# Patient Record
Sex: Male | Born: 1950 | Race: White | Hispanic: No | Marital: Married | Smoking: Never smoker
Health system: Southern US, Community
[De-identification: ages and names within clinical notes are randomized; demographics above are authoritative.]

## PROBLEM LIST (undated history)

## (undated) DIAGNOSIS — I493 Ventricular premature depolarization: Secondary | ICD-10-CM

## (undated) DIAGNOSIS — E785 Hyperlipidemia, unspecified: Secondary | ICD-10-CM

## (undated) DIAGNOSIS — D696 Thrombocytopenia, unspecified: Secondary | ICD-10-CM

## (undated) DIAGNOSIS — K5792 Diverticulitis of intestine, part unspecified, without perforation or abscess without bleeding: Secondary | ICD-10-CM

## (undated) DIAGNOSIS — I10 Essential (primary) hypertension: Secondary | ICD-10-CM

## (undated) DIAGNOSIS — R202 Paresthesia of skin: Secondary | ICD-10-CM

## (undated) DIAGNOSIS — R7303 Prediabetes: Secondary | ICD-10-CM

## (undated) HISTORY — DX: Prediabetes: R73.03

## (undated) HISTORY — DX: Paresthesia of skin: R20.2

## (undated) HISTORY — DX: Ventricular premature depolarization: I49.3

## (undated) HISTORY — PX: CYST EXCISION: SHX5701

## (undated) HISTORY — DX: Hyperlipidemia, unspecified: E78.5

## (undated) HISTORY — DX: Thrombocytopenia, unspecified: D69.6

## (undated) HISTORY — PX: TONSILLECTOMY: SUR1361

---

## 1998-11-26 ENCOUNTER — Encounter: Payer: Self-pay | Admitting: Family Medicine

## 1998-11-26 ENCOUNTER — Ambulatory Visit (HOSPITAL_COMMUNITY): Admission: RE | Admit: 1998-11-26 | Discharge: 1998-11-26 | Payer: Self-pay | Admitting: Family Medicine

## 1998-12-04 ENCOUNTER — Encounter: Admission: RE | Admit: 1998-12-04 | Discharge: 1998-12-18 | Payer: Self-pay | Admitting: Family Medicine

## 2007-03-31 ENCOUNTER — Ambulatory Visit (HOSPITAL_BASED_OUTPATIENT_CLINIC_OR_DEPARTMENT_OTHER): Admission: RE | Admit: 2007-03-31 | Discharge: 2007-03-31 | Payer: Self-pay | Admitting: Surgery

## 2007-03-31 ENCOUNTER — Encounter (INDEPENDENT_AMBULATORY_CARE_PROVIDER_SITE_OTHER): Payer: Self-pay | Admitting: Surgery

## 2009-04-30 ENCOUNTER — Ambulatory Visit (HOSPITAL_BASED_OUTPATIENT_CLINIC_OR_DEPARTMENT_OTHER): Admission: RE | Admit: 2009-04-30 | Discharge: 2009-04-30 | Payer: Self-pay | Admitting: Surgery

## 2009-04-30 ENCOUNTER — Encounter (INDEPENDENT_AMBULATORY_CARE_PROVIDER_SITE_OTHER): Payer: Self-pay | Admitting: Surgery

## 2010-10-15 LAB — BASIC METABOLIC PANEL
BUN: 22 mg/dL (ref 6–23)
CO2: 30 mEq/L (ref 19–32)
Calcium: 9.4 mg/dL (ref 8.4–10.5)
Chloride: 103 mEq/L (ref 96–112)
Creatinine, Ser: 0.92 mg/dL (ref 0.4–1.5)
GFR calc Af Amer: >60 mL/min (ref >60)
GFR calc non Af Amer: >60 mL/min (ref >60)
Glucose, Bld: 146 mg/dL — ABNORMAL HIGH (ref 70–99)
Potassium: 3.9 mEq/L (ref 3.5–5.1)
Sodium: 140 mEq/L (ref 135–145)

## 2010-10-15 LAB — POCT HEMOGLOBIN-HEMACUE: Hemoglobin: 17.4 g/dL — ABNORMAL HIGH (ref 13.0–17.0)

## 2012-08-23 ENCOUNTER — Ambulatory Visit (INDEPENDENT_AMBULATORY_CARE_PROVIDER_SITE_OTHER): Payer: 59 | Admitting: Surgery

## 2012-08-23 ENCOUNTER — Ambulatory Visit (INDEPENDENT_AMBULATORY_CARE_PROVIDER_SITE_OTHER): Payer: Self-pay | Admitting: Surgery

## 2012-08-23 ENCOUNTER — Encounter (INDEPENDENT_AMBULATORY_CARE_PROVIDER_SITE_OTHER): Payer: Self-pay | Admitting: Surgery

## 2012-08-23 VITALS — BP 124/76 | HR 88 | Temp 98.0°F | Resp 16 | Ht 69.0 in | Wt 218.8 lb

## 2012-08-23 DIAGNOSIS — D1779 Benign lipomatous neoplasm of other sites: Secondary | ICD-10-CM

## 2012-08-23 DIAGNOSIS — D171 Benign lipomatous neoplasm of skin and subcutaneous tissue of trunk: Secondary | ICD-10-CM

## 2012-08-23 NOTE — Progress Notes (Signed)
Chief Complaint:  Recurrent left pectoral lipoma  History of Present Illness:  Matthew Compton is an 62 y.o. male had lipoma about the size of a marble this past summer.  It has ballooned since then and now is the size of a racket ball.  Needs to be reexcised.    History reviewed. No pertinent past medical history.  Past Surgical History  Procedure Laterality Date  . Tonsillectomy    . Cyst excision      x2    Current Outpatient Prescriptions  Medication Sig Dispense Refill  . aspirin 81 MG tablet Take 81 mg by mouth daily.      Marland Kitchen atorvastatin (LIPITOR) 20 MG tablet       . fish oil-omega-3 fatty acids 1000 MG capsule Take 2 g by mouth daily.      . niacin (NIASPAN) 500 MG CR tablet       . MICARDIS HCT 80-12.5 MG per tablet        No current facility-administered medications for this visit.   Review of patient's allergies indicates no known allergies. Family History  Problem Relation Age of Onset  . Cancer Mother     lung  . Heart disease Mother   . Heart disease Father    Social History:   reports that he has never smoked. He has never used smokeless tobacco. He reports that  drinks alcohol. He reports that he does not use illicit drugs.   REVIEW OF SYSTEMS - PERTINENT POSITIVES ONLY: noncontributory  Physical Exam:   Blood pressure 124/76, pulse 88, temperature 98 F (36.7 C), temperature source Temporal, resp. rate 16, height 5\' 9"  (1.753 m), weight 218 lb 12.8 oz (99.247 kg). Body mass index is 32.3 kg/(m^2).  Gen:  WDWN WM NAD  Neurological: Alert and oriented to person, place, and time. Motor and sensory function is grossly intact  Head: Normocephalic and atraumatic.  Eyes: Conjunctivae are normal. Pupils are equal, round, and reactive to light. No scleral icterus.  Neck: Normal range of motion. Neck supple. No tracheal deviation or thyromegaly present.  Cardiovascular:  SR without murmurs or gallops.  No carotid bruits Chest:  Racket ball sized mass on left  anterior pectoral region Respiratory: Effort normal.  No respiratory distress. No chest wall tenderness. Breath sounds normal.  No wheezes, rales or rhonchi.  Abdomen:  unremarkable GU: Musculoskeletal: Normal range of motion. Extremities are nontender. No cyanosis, edema or clubbing noted Lymphadenopathy: No cervical, preauricular, postauricular or axillary adenopathy is present Skin: Skin is warm and dry. No rash noted. No diaphoresis. No erythema. No pallor. Pscyh: Normal mood and affect. Behavior is normal. Judgment and thought content normal.   LABORATORY RESULTS: No results found for this or any previous visit (from the past 48 hour(s)).  RADIOLOGY RESULTS: No results found.  Problem List: There is no problem list on file for this patient.   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  Excision under general    [name], MD, [facility] [phone] beeper [phone]  08/23/2012 2:18 PM

## 2012-08-23 NOTE — Patient Instructions (Signed)
Thanks for your patience.  If you need further assistance after leaving the office, please call our office and speak with a [facility] nurse.  [phone].  If you want to leave a message for Dr. Shamiracle Gorden, please call his office phone at [phone]. 

## 2012-09-07 ENCOUNTER — Encounter (HOSPITAL_BASED_OUTPATIENT_CLINIC_OR_DEPARTMENT_OTHER): Payer: Self-pay | Admitting: *Deleted

## 2012-09-07 ENCOUNTER — Encounter (HOSPITAL_BASED_OUTPATIENT_CLINIC_OR_DEPARTMENT_OTHER)
Admission: RE | Admit: 2012-09-07 | Discharge: 2012-09-07 | Disposition: A | Discharge status: Home / Self Care | Payer: 59 | Source: Ambulatory Visit | Attending: Surgery | Admitting: Surgery

## 2012-09-07 LAB — BASIC METABOLIC PANEL
Chloride: 102 mEq/L (ref 96–112)
GFR calc non Af Amer: >90 mL/min (ref >90)
Sodium: 141 mEq/L (ref 135–145)

## 2012-09-07 NOTE — Progress Notes (Signed)
Bring all medications. Coming in today for BMET and EKG. Notified Dr. Ermalene Searing office no orders in computer.

## 2012-09-12 ENCOUNTER — Other Ambulatory Visit (INDEPENDENT_AMBULATORY_CARE_PROVIDER_SITE_OTHER): Payer: Self-pay | Admitting: Surgery

## 2012-09-13 ENCOUNTER — Ambulatory Visit (HOSPITAL_BASED_OUTPATIENT_CLINIC_OR_DEPARTMENT_OTHER): Payer: 59 | Admitting: Anesthesiology

## 2012-09-13 ENCOUNTER — Ambulatory Visit (HOSPITAL_BASED_OUTPATIENT_CLINIC_OR_DEPARTMENT_OTHER)
Admission: RE | Admit: 2012-09-13 | Discharge: 2012-09-13 | Disposition: A | Discharge status: Home / Self Care | Payer: 59 | Source: Ambulatory Visit | Attending: Surgery | Admitting: Surgery

## 2012-09-13 ENCOUNTER — Encounter (HOSPITAL_BASED_OUTPATIENT_CLINIC_OR_DEPARTMENT_OTHER): Payer: Self-pay | Admitting: *Deleted

## 2012-09-13 ENCOUNTER — Encounter (HOSPITAL_BASED_OUTPATIENT_CLINIC_OR_DEPARTMENT_OTHER)
Admission: RE | Disposition: A | Discharge status: Home / Self Care | Payer: Self-pay | Source: Ambulatory Visit | Attending: Surgery

## 2012-09-13 ENCOUNTER — Encounter (HOSPITAL_BASED_OUTPATIENT_CLINIC_OR_DEPARTMENT_OTHER): Payer: Self-pay | Admitting: Anesthesiology

## 2012-09-13 DIAGNOSIS — Z6833 Body mass index (BMI) 33.0-33.9, adult: Secondary | ICD-10-CM | POA: Insufficient documentation

## 2012-09-13 DIAGNOSIS — Z79899 Other long term (current) drug therapy: Secondary | ICD-10-CM | POA: Insufficient documentation

## 2012-09-13 DIAGNOSIS — I10 Essential (primary) hypertension: Secondary | ICD-10-CM | POA: Insufficient documentation

## 2012-09-13 DIAGNOSIS — Z7982 Long term (current) use of aspirin: Secondary | ICD-10-CM | POA: Insufficient documentation

## 2012-09-13 DIAGNOSIS — D1779 Benign lipomatous neoplasm of other sites: Secondary | ICD-10-CM | POA: Insufficient documentation

## 2012-09-13 DIAGNOSIS — D1739 Benign lipomatous neoplasm of skin and subcutaneous tissue of other sites: Secondary | ICD-10-CM

## 2012-09-13 HISTORY — DX: Essential (primary) hypertension: I10

## 2012-09-13 HISTORY — PX: LIPOMA EXCISION: SHX5283

## 2012-09-13 LAB — POCT HEMOGLOBIN-HEMACUE: Hemoglobin: 17.6 g/dL — ABNORMAL HIGH (ref 13.0–17.0)

## 2012-09-13 SURGERY — EXCISION LIPOMA
Anesthesia: General | Site: Chest | Laterality: Left | Wound class: Clean

## 2012-09-13 MED ORDER — OXYCODONE HCL 5 MG PO TABS: 5.0000 mg | ORAL_TABLET | ORAL | Status: DC | PRN

## 2012-09-13 MED ORDER — FENTANYL CITRATE 0.05 MG/ML IJ SOLN: 50.0000 ug | INTRAMUSCULAR | Status: DC | PRN

## 2012-09-13 MED ORDER — OXYCODONE-ACETAMINOPHEN 5-325 MG PO TABS: 1.0000 | ORAL_TABLET | ORAL | Status: DC | PRN

## 2012-09-13 MED ORDER — LACTATED RINGERS IV SOLN
INTRAVENOUS | Status: DC
  Administered 2012-09-13 (×2): via INTRAVENOUS

## 2012-09-13 MED ORDER — CHLORHEXIDINE GLUCONATE 4 % EX LIQD: 1.0000 "application " | Freq: Once | CUTANEOUS | Status: DC

## 2012-09-13 MED ORDER — SODIUM CHLORIDE 0.9 % IJ SOLN: 3.0000 mL | INTRAMUSCULAR | Status: DC | PRN

## 2012-09-13 MED ORDER — DEXAMETHASONE SODIUM PHOSPHATE 4 MG/ML IJ SOLN
INTRAMUSCULAR | Status: DC | PRN
  Administered 2012-09-13: 10 mg via INTRAVENOUS

## 2012-09-13 MED ORDER — MIDAZOLAM HCL 2 MG/2ML IJ SOLN: 0.5000 mg | Freq: Once | INTRAMUSCULAR | Status: DC | PRN

## 2012-09-13 MED ORDER — FENTANYL CITRATE 0.05 MG/ML IJ SOLN
INTRAMUSCULAR | Status: DC | PRN
  Administered 2012-09-13: 25 ug via INTRAVENOUS
  Administered 2012-09-13: 100 ug via INTRAVENOUS
  Administered 2012-09-13: 25 ug via INTRAVENOUS

## 2012-09-13 MED ORDER — LIDOCAINE HCL (CARDIAC) 20 MG/ML IV SOLN
INTRAVENOUS | Status: DC | PRN
  Administered 2012-09-13: 75 mg via INTRAVENOUS

## 2012-09-13 MED ORDER — BUPIVACAINE HCL (PF) 0.5 % IJ SOLN
INTRAMUSCULAR | Status: DC | PRN
  Administered 2012-09-13: 10 mL

## 2012-09-13 MED ORDER — HYDROMORPHONE HCL PF 1 MG/ML IJ SOLN
0.2500 mg | INTRAMUSCULAR | Status: DC | PRN
  Administered 2012-09-13 (×2): 0.5 mg via INTRAVENOUS

## 2012-09-13 MED ORDER — ONDANSETRON HCL 4 MG/2ML IJ SOLN: 4.0000 mg | Freq: Four times a day (QID) | INTRAMUSCULAR | Status: DC | PRN

## 2012-09-13 MED ORDER — PROMETHAZINE HCL 25 MG/ML IJ SOLN: 6.2500 mg | INTRAMUSCULAR | Status: DC | PRN

## 2012-09-13 MED ORDER — PROPOFOL 10 MG/ML IV BOLUS
INTRAVENOUS | Status: DC | PRN
  Administered 2012-09-13: 300 mg via INTRAVENOUS

## 2012-09-13 MED ORDER — OXYCODONE HCL 5 MG/5ML PO SOLN: 5.0000 mg | Freq: Once | ORAL | Status: DC | PRN

## 2012-09-13 MED ORDER — OXYCODONE HCL 5 MG PO TABS: 5.0000 mg | ORAL_TABLET | Freq: Once | ORAL | Status: DC | PRN

## 2012-09-13 MED ORDER — MEPERIDINE HCL 25 MG/ML IJ SOLN: 6.2500 mg | INTRAMUSCULAR | Status: DC | PRN

## 2012-09-13 MED ORDER — EPHEDRINE SULFATE 50 MG/ML IJ SOLN
INTRAMUSCULAR | Status: DC | PRN
  Administered 2012-09-13: 10 mg via INTRAVENOUS

## 2012-09-13 MED ORDER — ACETAMINOPHEN 10 MG/ML IV SOLN
1000.0000 mg | Freq: Once | INTRAVENOUS | Status: AC
  Administered 2012-09-13: 1000 mg via INTRAVENOUS

## 2012-09-13 MED ORDER — MIDAZOLAM HCL 2 MG/2ML IJ SOLN: 1.0000 mg | INTRAMUSCULAR | Status: DC | PRN

## 2012-09-13 MED ORDER — ACETAMINOPHEN 650 MG RE SUPP: 650.0000 mg | RECTAL | Status: DC | PRN

## 2012-09-13 MED ORDER — SODIUM CHLORIDE 0.9 % IJ SOLN: 3.0000 mL | Freq: Two times a day (BID) | INTRAMUSCULAR | Status: DC

## 2012-09-13 MED ORDER — ACETAMINOPHEN 325 MG PO TABS: 650.0000 mg | ORAL_TABLET | ORAL | Status: DC | PRN

## 2012-09-13 MED ORDER — MIDAZOLAM HCL 5 MG/5ML IJ SOLN
INTRAMUSCULAR | Status: DC | PRN
  Administered 2012-09-13: 2 mg via INTRAVENOUS

## 2012-09-13 MED ORDER — CEFAZOLIN SODIUM-DEXTROSE 2-3 GM-% IV SOLR
2.0000 g | INTRAVENOUS | Status: AC
  Administered 2012-09-13: 2 g via INTRAVENOUS

## 2012-09-13 MED ORDER — SODIUM CHLORIDE 0.9 % IV SOLN: 250.0000 mL | INTRAVENOUS | Status: DC | PRN

## 2012-09-13 MED ORDER — HEPARIN SODIUM (PORCINE) 5000 UNIT/ML IJ SOLN
5000.0000 [IU] | Freq: Once | INTRAMUSCULAR | Status: AC
  Administered 2012-09-13: 5000 [IU] via SUBCUTANEOUS

## 2012-09-13 SURGICAL SUPPLY — 43 items
BENZOIN TINCTURE PRP APPL 2/3 (GAUZE/BANDAGES/DRESSINGS) IMPLANT
BLADE SURG 15 STRL LF DISP TIS (BLADE) ×1 IMPLANT
BLADE SURG 15 STRL SS (BLADE) ×1
BLADE SURG ROTATE 9660 (MISCELLANEOUS) ×2 IMPLANT
CANISTER SUCTION 1200CC (MISCELLANEOUS) ×2 IMPLANT
CLEANER CAUTERY TIP 5X5 PAD (MISCELLANEOUS) ×1 IMPLANT
CLOTH BEACON ORANGE TIMEOUT ST (SAFETY) ×2 IMPLANT
COVER MAYO STAND STRL (DRAPES) ×2 IMPLANT
COVER TABLE BACK 60X90 (DRAPES) ×2 IMPLANT
DECANTER SPIKE VIAL GLASS SM (MISCELLANEOUS) ×2 IMPLANT
DERMABOND ADVANCED (GAUZE/BANDAGES/DRESSINGS) ×1
DERMABOND ADVANCED .7 DNX12 (GAUZE/BANDAGES/DRESSINGS) ×1 IMPLANT
DRAPE PED LAPAROTOMY (DRAPES) IMPLANT
DRAPE U-SHAPE 76X120 STRL (DRAPES) IMPLANT
ELECT REM PT RETURN 9FT ADLT (ELECTROSURGICAL) ×2
ELECTRODE REM PT RTRN 9FT ADLT (ELECTROSURGICAL) ×1 IMPLANT
GAUZE SPONGE 4X4 12PLY STRL LF (GAUZE/BANDAGES/DRESSINGS) IMPLANT
GLOVE BIO SURGEON STRL SZ8 (GLOVE) ×2 IMPLANT
GLOVE EXAM NITRILE MD LF STRL (GLOVE) ×2 IMPLANT
GLOVE SKINSENSE NS SZ7.0 (GLOVE) ×1
GLOVE SKINSENSE STRL SZ7.0 (GLOVE) ×1 IMPLANT
GOWN PREVENTION PLUS XLARGE (GOWN DISPOSABLE) ×4 IMPLANT
GOWN PREVENTION PLUS XXLARGE (GOWN DISPOSABLE) IMPLANT
NEEDLE 27GAX1X1/2 (NEEDLE) IMPLANT
NEEDLE HYPO 25X1 1.5 SAFETY (NEEDLE) ×2 IMPLANT
NS IRRIG 1000ML POUR BTL (IV SOLUTION) ×2 IMPLANT
PACK BASIN DAY SURGERY FS (CUSTOM PROCEDURE TRAY) ×2 IMPLANT
PAD CLEANER CAUTERY TIP 5X5 (MISCELLANEOUS) ×1
PENCIL BUTTON HOLSTER BLD 10FT (ELECTRODE) ×2 IMPLANT
STRIP CLOSURE SKIN 1/2X4 (GAUZE/BANDAGES/DRESSINGS) IMPLANT
SUT ETHILON 3 0 FSL (SUTURE) IMPLANT
SUT ETHILON 5 0 PS 2 18 (SUTURE) IMPLANT
SUT VIC AB 4-0 SH 18 (SUTURE) ×2 IMPLANT
SUT VIC AB 5-0 PS2 18 (SUTURE) IMPLANT
SUT VICRYL 3-0 CR8 SH (SUTURE) IMPLANT
SYR BULB 3OZ (MISCELLANEOUS) IMPLANT
SYR CONTROL 10ML LL (SYRINGE) ×2 IMPLANT
TOWEL OR 17X24 6PK STRL BLUE (TOWEL DISPOSABLE) ×2 IMPLANT
TRAY DSU PREP LF (CUSTOM PROCEDURE TRAY) ×2 IMPLANT
TUBE CONNECTING 20X1/4 (TUBING) ×2 IMPLANT
UNDERPAD 30X30 INCONTINENT (UNDERPADS AND DIAPERS) ×2 IMPLANT
WATER STERILE IRR 1000ML POUR (IV SOLUTION) IMPLANT
YANKAUER SUCT BULB TIP NO VENT (SUCTIONS) ×2 IMPLANT

## 2012-09-13 NOTE — Anesthesia Procedure Notes (Signed)
Procedure Name: LMA Insertion Date/Time: 09/13/2012 8:29 AM Performed by: Zenia Resides D Pre-anesthesia Checklist: Patient identified, Emergency Drugs available, Suction available and Patient being monitored Patient Re-evaluated:Patient Re-evaluated prior to inductionOxygen Delivery Method: Circle System Utilized Preoxygenation: Pre-oxygenation with 100% oxygen Intubation Type: IV induction Ventilation: Mask ventilation without difficulty LMA: LMA inserted LMA Size: 5.0 Number of attempts: 1 Airway Equipment and Method: bite block Placement Confirmation: positive ETCO2 Tube secured with: Tape Dental Injury: Teeth and Oropharynx as per pre-operative assessment

## 2012-09-13 NOTE — Anesthesia Postprocedure Evaluation (Signed)
  Anesthesia Post-op Note  Patient: Matthew Compton  Procedure(s) Performed: Procedure(s): Excision of Left Pectoral Mass-- Lipoma (Left)  Patient Location: PACU  Anesthesia Type:General  Level of Consciousness: awake and alert   Airway and Oxygen Therapy: Patient Spontanous Breathing  Post-op Pain: mild  Post-op Assessment: Post-op Vital signs reviewed, Patient's Cardiovascular Status Stable, Respiratory Function Stable, Patent Airway and No signs of Nausea or vomiting  Post-op Vital Signs: Reviewed and stable  Complications: No apparent anesthesia complications

## 2012-09-13 NOTE — H&P (View-Only) (Signed)
Chief Complaint:  Recurrent left pectoral lipoma  History of Present Illness:  Matthew Compton is an 62 y.o. male had lipoma about the size of a marble this past summer.  It has ballooned since then and now is the size of a racket ball.  Needs to be reexcised.    History reviewed. No pertinent past medical history.  Past Surgical History  Procedure Laterality Date  . Tonsillectomy    . Cyst excision      x2    Current Outpatient Prescriptions  Medication Sig Dispense Refill  . aspirin 81 MG tablet Take 81 mg by mouth daily.      . atorvastatin (LIPITOR) 20 MG tablet       . fish oil-omega-3 fatty acids 1000 MG capsule Take 2 g by mouth daily.      . niacin (NIASPAN) 500 MG CR tablet       . MICARDIS HCT 80-12.5 MG per tablet        No current facility-administered medications for this visit.   Review of patient's allergies indicates no known allergies. Family History  Problem Relation Age of Onset  . Cancer Mother     lung  . Heart disease Mother   . Heart disease Father    Social History:   reports that he has never smoked. He has never used smokeless tobacco. He reports that  drinks alcohol. He reports that he does not use illicit drugs.   REVIEW OF SYSTEMS - PERTINENT POSITIVES ONLY: noncontributory  Physical Exam:   Blood pressure 124/76, pulse 88, temperature 98 F (36.7 C), temperature source Temporal, resp. rate 16, height 5' 9" (1.753 m), weight 218 lb 12.8 oz (99.247 kg). Body mass index is 32.3 kg/(m^2).  Gen:  WDWN WM NAD  Neurological: Alert and oriented to person, place, and time. Motor and sensory function is grossly intact  Head: Normocephalic and atraumatic.  Eyes: Conjunctivae are normal. Pupils are equal, round, and reactive to light. No scleral icterus.  Neck: Normal range of motion. Neck supple. No tracheal deviation or thyromegaly present.  Cardiovascular:  SR without murmurs or gallops.  No carotid bruits Chest:  Racket ball sized mass on left  anterior pectoral region Respiratory: Effort normal.  No respiratory distress. No chest wall tenderness. Breath sounds normal.  No wheezes, rales or rhonchi.  Abdomen:  unremarkable GU: Musculoskeletal: Normal range of motion. Extremities are nontender. No cyanosis, edema or clubbing noted Lymphadenopathy: No cervical, preauricular, postauricular or axillary adenopathy is present Skin: Skin is warm and dry. No rash noted. No diaphoresis. No erythema. No pallor. Pscyh: Normal mood and affect. Behavior is normal. Judgment and thought content normal.   LABORATORY RESULTS: No results found for this or any previous visit (from the past 48 hour(s)).  RADIOLOGY RESULTS: No results found.  Problem List: There is no problem list on file for this patient.   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  Excision under general    [name] B. Jamaurie Bernier, MD, FACS  [facility] [phone] beeper [phone]  08/23/2012 2:18 PM     

## 2012-09-13 NOTE — Transfer of Care (Signed)
Immediate Anesthesia Transfer of Care Note  Patient: Matthew Compton  Procedure(s) Performed: Procedure(s): Excision of Left Pectoral Mass-- Lipoma (Left)  Patient Location: PACU  Anesthesia Type:General  Level of Consciousness: awake  Airway & Oxygen Therapy: Patient Spontanous Breathing and Patient connected to face mask oxygen  Post-op Assessment: Report given to PACU RN and Post -op Vital signs reviewed and stable  Post vital signs: Reviewed and stable  Complications: No apparent anesthesia complications

## 2012-09-13 NOTE — Anesthesia Preprocedure Evaluation (Addendum)
Anesthesia Evaluation  Patient identified by MRN, date of birth, ID band Patient awake    Reviewed: Allergy & Precautions, H&P , NPO status , Patient's Chart, lab work & pertinent test results  History of Anesthesia Complications Negative for: history of anesthetic complications  Airway Mallampati: I TM Distance: >3 FB Neck ROM: Full    Dental  (+) Caps and Dental Advisory Given   Pulmonary neg pulmonary ROS,  breath sounds clear to auscultation  Pulmonary exam normal       Cardiovascular hypertension, Rhythm:Regular Rate:Normal     Neuro/Psych negative neurological ROS     GI/Hepatic negative GI ROS, Neg liver ROS,   Endo/Other  Morbid obesity  Renal/GU negative Renal ROS     Musculoskeletal   Abdominal (+) + obese,   Peds  Hematology   Anesthesia Other Findings   Reproductive/Obstetrics                           Anesthesia Physical Anesthesia Plan  ASA: II  Anesthesia Plan: General   Post-op Pain Management:    Induction: Intravenous  Airway Management Planned: LMA  Additional Equipment:   Intra-op Plan:   Post-operative Plan:   Informed Consent: I have reviewed the patients History and Physical, chart, labs and discussed the procedure including the risks, benefits and alternatives for the proposed anesthesia with the patient or authorized representative who has indicated his/her understanding and acceptance.   Dental advisory given  Plan Discussed with: CRNA and Surgeon  Anesthesia Plan Comments: (Plan routine monitors, GA- LMA OK)        Anesthesia Quick Evaluation

## 2012-09-13 NOTE — Op Note (Signed)
Surgeon: Wenda Low, MD, FACS  Asst:  none  Anes:  general  Procedure: Excision of 10.5 x 8 x 5 cm lipomatous mass of the left lateral chest wall over the border of the pectoralis major and the entrance into the axilla; 3 cm separate "daughter" lipoma removed  Diagnosis: Lipomatous masses  Complications: none  EBL:   minimal cc  Description of Procedure:  The patient was taken to oh or 3 at [facility] day surgery and given general anesthesia. The left chest where the mass had been previously marked by me and with the help of the patient was easily visible and was about the size of a baseball that was beneath a previous excision site. After a timeout the area which had been prepped with PCMX and draped sterilely was first approached by excising the old scar and then I extended the incision medially and laterally to allow me to create a margin around this. My initial concern was about this being a sarcoma so I raised skin flaps inferiorly and superiorly and then as I became closer to the chest wall I was able to appreciate a capsule and then this mass shelled out without any appreciable connection to the fascia and it came out in toto. This was a large mass that was 10.5 cm in greatest dimension. Once removed I found a separate discrete daughter mass that I excised and sent both in the same specimen container.  I checked for bleeding and cauterized the a few spots that were oozing but there were no significant bleeding points noted. I irrigated and infiltrated the area with half percent Marcaine.  The wound was then closed in layers with 4-0 Vicryl subcutaneously and subcuticularly and then with Dermabond on the skin. Patient tolerated procedure well. He will be given some Vicodin for pain and will be discharged from [facility] day surgery.  [name], MD, [facility], [place] [phone]

## 2012-09-13 NOTE — Interval H&P Note (Signed)
History and Physical Interval Note:  09/13/2012 8:19 AM  Matthew Compton  has presented today for surgery, with the diagnosis of recurrent large lipoma of chest 5 cm  The various methods of treatment have been discussed with the patient and family. After consideration of risks, benefits and other options for treatment, the patient has consented to  Procedure(s): EXCISION of pectoral massLIPOMA  (N/A) as a surgical intervention .  The patient's history has been reviewed, patient examined, no change in status, stable for surgery.  I have reviewed the patient's chart and labs.  Questions were answered to the patient's satisfaction.     [name],MATTHEW B

## 2012-09-14 ENCOUNTER — Encounter (HOSPITAL_BASED_OUTPATIENT_CLINIC_OR_DEPARTMENT_OTHER): Payer: Self-pay | Admitting: Surgery

## 2012-09-18 ENCOUNTER — Telehealth (INDEPENDENT_AMBULATORY_CARE_PROVIDER_SITE_OTHER): Payer: Self-pay

## 2012-09-18 NOTE — Telephone Encounter (Signed)
The pt called because he needs to schedule a 2 wk appointment and wants his pathology report results.  Please call.

## 2012-09-19 ENCOUNTER — Telehealth (INDEPENDENT_AMBULATORY_CARE_PROVIDER_SITE_OTHER): Payer: Self-pay

## 2012-09-19 NOTE — Telephone Encounter (Signed)
LMOM for pt letting him know that I have scheduled his 1st PO appt with MM for Fri, March 21 @145p .

## 2012-09-29 ENCOUNTER — Encounter (INDEPENDENT_AMBULATORY_CARE_PROVIDER_SITE_OTHER): Payer: Self-pay | Admitting: Surgery

## 2012-09-29 ENCOUNTER — Ambulatory Visit (INDEPENDENT_AMBULATORY_CARE_PROVIDER_SITE_OTHER): Payer: 59 | Admitting: Surgery

## 2012-09-29 ENCOUNTER — Encounter (INDEPENDENT_AMBULATORY_CARE_PROVIDER_SITE_OTHER): Payer: 59 | Admitting: Surgery

## 2012-09-29 VITALS — BP 122/70 | HR 64 | Resp 16 | Ht 69.0 in | Wt 225.0 lb

## 2012-09-29 DIAGNOSIS — D171 Benign lipomatous neoplasm of skin and subcutaneous tissue of trunk: Secondary | ICD-10-CM

## 2012-09-29 DIAGNOSIS — D1779 Benign lipomatous neoplasm of other sites: Secondary | ICD-10-CM

## 2012-09-29 NOTE — Progress Notes (Signed)
Matthew Compton 62 y.o.  Body mass index is 33.21 kg/(m^2).  There is no problem list on file for this patient.   No Known Allergies  Past Surgical History  Procedure Laterality Date  . Cyst excision      x2  . Tonsillectomy      as child  . Lipoma excision Left 09/13/2012    Procedure: Excision of Left Pectoral Mass-- Lipoma;  Surgeon: Valarie Merino, MD;  Location: [facility];  Service: General;  Laterality: Left;   [name], RN No diagnosis found.  Doing very well after removal of a very large recurrent lipoma probably 9 cm in aggregate. This was benign. His incision is healing nicely. I will see him again as needed. Return when necessary [name], MD, [facility] [phone] beeper [phone]  09/29/2012 1:48 PM

## 2012-09-29 NOTE — Patient Instructions (Signed)
Thanks for your patience.  If you need further assistance after leaving the office, please call our office and speak with a [facility] nurse.  [phone].  If you want to leave a message for Dr. Adrieanna Boteler, please call his office phone at [phone]. 

## 2013-05-17 ENCOUNTER — Other Ambulatory Visit: Payer: Self-pay

## 2016-09-07 ENCOUNTER — Ambulatory Visit (INDEPENDENT_AMBULATORY_CARE_PROVIDER_SITE_OTHER): Payer: Commercial Managed Care - HMO | Admitting: Neurology

## 2016-09-07 ENCOUNTER — Encounter: Payer: Self-pay | Admitting: Neurology

## 2016-09-07 DIAGNOSIS — M2142 Flat foot [pes planus] (acquired), left foot: Secondary | ICD-10-CM

## 2016-09-07 DIAGNOSIS — M545 Low back pain, unspecified: Secondary | ICD-10-CM | POA: Insufficient documentation

## 2016-09-07 DIAGNOSIS — R202 Paresthesia of skin: Secondary | ICD-10-CM

## 2016-09-07 DIAGNOSIS — R29898 Other symptoms and signs involving the musculoskeletal system: Secondary | ICD-10-CM | POA: Insufficient documentation

## 2016-09-07 DIAGNOSIS — G8929 Other chronic pain: Secondary | ICD-10-CM

## 2016-09-07 DIAGNOSIS — M542 Cervicalgia: Secondary | ICD-10-CM

## 2016-09-07 NOTE — Progress Notes (Signed)
PATIENT: Matthew Compton DOB: 03-22-1951  Chief Complaint  Patient presents with  . Paresthesia    Reports numbness in all ten toes present since last June.  For the last two weeks, he is also having pain in his bilateral heels when walking.    Marland Kitchen PCP    Antony Contras, MD      HISTORICAL  Matthew Compton is a 66 years old right-handed male, seen in refer by his primary care physician Dr. Margaret Pyle for evaluation of bilateral feet paresthesia, initially evaluation was on September 07 2016.  I reviewed and summarized the referring note, he had a history of hypertension, hyperlipidemia, diverticulitis, last episode was in July 2011, remote diagnosis of prediabetes,  Since June of 2017, he noticed numbness at bilateral toes, he also noticed intermittent shooting pain from bottom of his feet, over the past 2 months, he also noticed left heel pain, especially bearing weight. He denies finger paresthesia,  He had a history of chronic neck pain, radiating pain to right shoulder, chronic low back pain, that intermittent flareup, also noticed worsening urinary urgency. On today's examination, he was found to have hyperreflexia, bilateral Babinski signs, left toe extension flexion weakness.  I looked over laboratory evaluation in February, hemoglobin was mildly elevated 19 point 7, glucose was mildly elevated to 101, normal TSH, LDL was 81, A1c was 5.8, normal B12 400    REVIEW OF SYSTEMS: Full 14 system review of systems performed and notable only for numbness,  ALLERGIES: No Known Allergies  HOME MEDICATIONS: Current Outpatient Prescriptions  Medication Sig Dispense Refill  . aspirin 81 MG tablet Take 81 mg by mouth daily.    Marland Kitchen atorvastatin (LIPITOR) 20 MG tablet     . fish oil-omega-3 fatty acids 1000 MG capsule Take 2 g by mouth daily.    Marland Kitchen MICARDIS HCT 80-12.5 MG per tablet      No current facility-administered medications for this visit.     PAST MEDICAL HISTORY: Past Medical  History:  Diagnosis Date  . Hyperlipemia   . Hypertension   . Paresthesia   . Prediabetes   . PVC (premature ventricular contraction)   . Thrombocytopenia ([place])     PAST SURGICAL HISTORY: Past Surgical History:  Procedure Laterality Date  . CYST EXCISION     x2  . LIPOMA EXCISION Left 09/13/2012   Procedure: Excision of Left Pectoral Mass-- Lipoma;  Surgeon: Pedro Earls, MD;  Location: [place];  Service: General;  Laterality: Left;  . TONSILLECTOMY     as child    FAMILY HISTORY: Family History  Problem Relation Age of Onset  . Heart disease Mother   . Hypertension Mother   . Lung cancer Mother   . Heart disease Father   . Hypertension Father   . Diabetes Father   . Congestive Heart Failure Father   . Prostate cancer Father   . Migraines Sister   . Bladder Cancer Sister   . Heart attack Maternal Grandfather     SOCIAL HISTORY:  Social History   Social History  . Marital status: Married    Spouse name: N/A  . Number of children: 1  . Years of education: HS   Occupational History  . Drives [employer] part-time   . Retired Engineer, structural    Social History Main Topics  . Smoking status: Never Smoker  . Smokeless tobacco: Never Used  . Alcohol use Yes     Comment: 3 times  a week  . Drug use: No  . Sexual activity: Not on file   Other Topics Concern  . Not on file   Social History Narrative   Lives at home with wife.   Right-handed.   2-3 cups caffeine per month.        PHYSICAL EXAM   Vitals:   09/07/16 0835  BP: (!) 142/90  Pulse: 70  Weight: 221 lb (100.2 kg)  Height: 5' 9.75" (1.772 m)    Not recorded      Body mass index is 31.94 kg/m.  PHYSICAL EXAMNIATION:  Gen: NAD, conversant, well nourised, obese, well groomed                     Cardiovascular: Regular rate rhythm, no peripheral edema, warm, nontender. Eyes: Conjunctivae clear without exudates or hemorrhage Neck: Supple, no carotid bruits. Pulmonary:  Clear to auscultation bilaterally   NEUROLOGICAL EXAM:  MENTAL STATUS: Speech:    Speech is normal; fluent and spontaneous with normal comprehension.  Cognition:     Orientation to time, place and person     Normal recent and remote memory     Normal Attention span and concentration     Normal Language, naming, repeating,spontaneous speech     Fund of knowledge   CRANIAL NERVES: CN II: Visual fields are full to confrontation. Fundoscopic exam is normal with sharp discs and no vascular changes. Pupils are round equal and briskly reactive to light. CN III, IV, VI: extraocular movement are normal. No ptosis. CN V: Facial sensation is intact to pinprick in all 3 divisions bilaterally. Corneal responses are intact.  CN VII: Face is symmetric with normal eye closure and smile. CN VIII: Hearing is normal to rubbing fingers CN IX, X: Palate elevates symmetrically. Phonation is normal. CN XI: Head turning and shoulder shrug are intact CN XII: Tongue is midline with normal movements and no atrophy.  MOTOR: Left Hammer toes, mild left toe extension, flexion weakness.  REFLEXES: Reflexes are 3 and symmetric at the biceps, triceps, knees, and absent at ankles. Plantar responses are extensor bilaterally  SENSORY: Length dependent decreased to light touch, pinprick, and vibratory sensation to distal shin level.  COORDINATION: Rapid alternating movements and fine finger movements are intact. There is no dysmetria on finger-to-nose and heel-knee-shin.    GAIT/STANCE: Posture is normal. Gait is steady with normal steps, base, arm swing, and turning. Heel and toe walking are normal. Tandem gait is normal.  Romberg is absent.   DIAGNOSTIC DATA (LABS, IMAGING, TESTING) - I reviewed patient records, labs, notes, testing and imaging myself where available.   ASSESSMENT AND PLAN  Matthew Compton is a 66 y.o. 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 Ph.D.  [facility] [address] [place], [place] [address] Ph: [phone] Fax: [phone]  CC: Antony Contras, MD

## 2016-09-15 ENCOUNTER — Ambulatory Visit: Payer: Commercial Managed Care - HMO

## 2016-09-15 DIAGNOSIS — R202 Paresthesia of skin: Secondary | ICD-10-CM

## 2016-09-15 DIAGNOSIS — M545 Low back pain: Secondary | ICD-10-CM

## 2016-09-15 DIAGNOSIS — M2142 Flat foot [pes planus] (acquired), left foot: Secondary | ICD-10-CM | POA: Diagnosis not present

## 2016-09-15 DIAGNOSIS — M542 Cervicalgia: Secondary | ICD-10-CM

## 2016-09-15 DIAGNOSIS — G8929 Other chronic pain: Secondary | ICD-10-CM

## 2016-09-15 DIAGNOSIS — R29898 Other symptoms and signs involving the musculoskeletal system: Secondary | ICD-10-CM

## 2016-10-15 ENCOUNTER — Ambulatory Visit (INDEPENDENT_AMBULATORY_CARE_PROVIDER_SITE_OTHER): Payer: Commercial Managed Care - HMO | Admitting: Neurology

## 2016-10-15 DIAGNOSIS — R202 Paresthesia of skin: Secondary | ICD-10-CM

## 2016-10-15 DIAGNOSIS — M542 Cervicalgia: Secondary | ICD-10-CM

## 2016-10-15 DIAGNOSIS — R29898 Other symptoms and signs involving the musculoskeletal system: Secondary | ICD-10-CM

## 2016-10-15 DIAGNOSIS — G8929 Other chronic pain: Secondary | ICD-10-CM

## 2016-10-15 DIAGNOSIS — M545 Low back pain: Secondary | ICD-10-CM

## 2016-10-15 NOTE — Progress Notes (Signed)
PATIENT: Matthew Compton DOB: [birth year]  No chief complaint on file.    HISTORICAL  Matthew Compton is a 66 years old right-handed male, seen in refer by his primary care physician Dr. Margaret Pyle for evaluation of bilateral feet paresthesia, initially evaluation was on September 07 2016.  I reviewed and summarized the referring note, he had a history of hypertension, hyperlipidemia, diverticulitis, last episode was in July 2011, remote diagnosis of prediabetes,  Since June of 2017, he noticed numbness at bilateral toes, he also noticed intermittent shooting pain from bottom of his feet, over the past 2 months, he also noticed left heel pain, especially bearing weight. He denies finger paresthesia,  He had a history of chronic neck pain, radiating pain to right shoulder, chronic low back pain, that intermittent flareup, also noticed worsening urinary urgency. On today's examination, he was found to have hyperreflexia, bilateral Babinski signs, left toe extension flexion weakness.  I looked over laboratory evaluation in February, hemoglobin was mildly elevated 19.7, glucose was mildly elevated to 101, normal TSH, LDL was 81, A1c was 5.8, normal B12 400   Update October 15 2016: We have personally reviewed MRI cervical spine in March 2018, multilevel mild degenerative disc disease no significant foraminal or canal stenosis. MRI of lumbar: Multilevel degenerative changes, most significant at L5-S1, minimum retrolisthesis, facet hypertrophy, broad-based disc bulging, there was no significant canal or foraminal stenosis.  Electrodiagnostic study today showed no significant abnormality, in specific, there is no evidence of large fiber peripheral neuropathy, right lumbar oh right cervical radiculopathy. But above findings could not rule out the possibility of a small fiber neuropathy.  I reviewed laboratory evaluation in February 2018, LDL was 81, A1c was 5.8, mild elevated glucose 101, and the B12  level was within normal limits.   REVIEW OF SYSTEMS: Full 14 system review of systems performed and notable only for numbness,  ALLERGIES: No Known Allergies  HOME MEDICATIONS: Current Outpatient Prescriptions  Medication Sig Dispense Refill  . aspirin 81 MG tablet Take 81 mg by mouth daily.    Marland Kitchen atorvastatin (LIPITOR) 20 MG tablet     . fish oil-omega-3 fatty acids 1000 MG capsule Take 2 g by mouth daily.    Marland Kitchen MICARDIS HCT 80-12.5 MG per tablet      No current facility-administered medications for this visit.     PAST MEDICAL HISTORY: Past Medical History:  Diagnosis Date  . Hyperlipemia   . Hypertension   . Paresthesia   . Prediabetes   . PVC (premature ventricular contraction)   . Thrombocytopenia ([place])     PAST SURGICAL HISTORY: Past Surgical History:  Procedure Laterality Date  . CYST EXCISION     x2  . LIPOMA EXCISION Left 09/13/2012   Procedure: Excision of Left Pectoral Mass-- Lipoma;  Surgeon: Pedro Earls, MD;  Location: [facility];  Service: General;  Laterality: Left;  . TONSILLECTOMY     as child    FAMILY HISTORY: Family History  Problem Relation Age of Onset  . Heart disease Mother   . Hypertension Mother   . Lung cancer Mother   . Heart disease Father   . Hypertension Father   . Diabetes Father   . Congestive Heart Failure Father   . Prostate cancer Father   . Migraines Sister   . Bladder Cancer Sister   . Heart attack Maternal Grandfather     SOCIAL HISTORY:  Social History   Social History  .  Marital status: Married    Spouse name: N/A  . Number of children: 1  . Years of education: HS   Occupational History  . Drives [employer] part-time   . Retired Engineer, structural    Social History Main Topics  . Smoking status: Never Smoker  . Smokeless tobacco: Never Used  . Alcohol use Yes     Comment: 3 times a week  . Drug use: No  . Sexual activity: Not on file   Other Topics Concern  . Not on file   Social  History Narrative   Lives at home with wife.   Right-handed.   2-3 cups caffeine per month.        PHYSICAL EXAM   There were no vitals filed for this visit.  Not recorded      There is no height or weight on file to calculate BMI.  PHYSICAL EXAMNIATION:  Gen: NAD, conversant, well nourised, obese, well groomed                     Cardiovascular: Regular rate rhythm, no peripheral edema, warm, nontender. Eyes: Conjunctivae clear without exudates or hemorrhage Neck: Supple, no carotid bruits. Pulmonary: Clear to auscultation bilaterally   NEUROLOGICAL EXAM:  MENTAL STATUS: Speech:    Speech is normal; fluent and spontaneous with normal comprehension.  Cognition:     Orientation to time, place and person     Normal recent and remote memory     Normal Attention span and concentration     Normal Language, naming, repeating,spontaneous speech     Fund of knowledge   CRANIAL NERVES: CN II: Visual fields are full to confrontation. Fundoscopic exam is normal with sharp discs and no vascular changes. Pupils are round equal and briskly reactive to light. CN III, IV, VI: extraocular movement are normal. No ptosis. CN V: Facial sensation is intact to pinprick in all 3 divisions bilaterally. Corneal responses are intact.  CN VII: Face is symmetric with normal eye closure and smile. CN VIII: Hearing is normal to rubbing fingers CN IX, X: Palate elevates symmetrically. Phonation is normal. CN XI: Head turning and shoulder shrug are intact CN XII: Tongue is midline with normal movements and no atrophy.  MOTOR: Left Hammer toes, mild left toe extension, flexion weakness.  REFLEXES: Reflexes are 3 and symmetric at the biceps, triceps, knees, and absent at ankles. Plantar responses are extensor bilaterally  SENSORY: Length dependent decreased to light touch, pinprick, and vibratory sensation to distal shin level.  COORDINATION: Rapid alternating movements and fine finger  movements are intact. There is no dysmetria on finger-to-nose and heel-knee-shin.    GAIT/STANCE: Posture is normal. Gait is steady with normal steps, base, arm swing, and turning. Heel and toe walking are normal. Tandem gait is normal.  Romberg is absent.   DIAGNOSTIC DATA (LABS, IMAGING, TESTING) - I reviewed patient records, labs, notes, testing and imaging myself where available.   ASSESSMENT AND PLAN  Matthew Compton is a 66 y.o. male   Bilateral feet paresthesia  Possible small fiber neuropathy  Laboratory evaluation showed mild elevated glucose, borderline A1c 5.8, which could possibly contributed to his symptoms,  There are mild symptomatic, slow progression, after discussed with patient, we decided to continue observe his symptoms,  If his symptoms continue to get worse, we may consider more extensive laboratory evaluation, or skin biopsy.   Matthew Compton, M.D. Ph.D.  [facility] [address], [place], [place] [address] Ph: (  [phone] Fax: [phone]  CC: Antony Contras, MD

## 2016-10-15 NOTE — Procedures (Signed)
Full Name: Matthew Compton Gender: Male MRN #: [id number] Date of Birth: 1950/08/15    Visit Date: 10/15/2016 11:17 Age: 66 Years 70 Months Old Examining Physician: Marcial Pacas, MD  Referring Physician: Krista Blue, MD History: 66 years old male with few months history of gradual onset bilateral distal toes numbness, no significant pain.  Summary of the test: Nerve conduction study: Bilateral sural, superficial peroneal sensory responses were normal. Bilateral peroneal to EDB, and tibial motor responses showed normal distal latency, C map amplitude, there was mild slow conduction velocity, borderline F wave latency. This could due to cold limb body temperature.   Electromyography: Selected needle examination of right upper and lower extremity muscles, right cervical, right lumbar sacral paraspinal muscles showed no significant abnormality.  Conclusion: There is no significant abnormality on today's examination. There is no evidence of large fiber neuropathy, right lumbar sacral radiculopathy, or right cervical radiculopathy.   The mildly decreased lower extremity motor conduction velocity, borderline prolonged F wave latency could due to cold limb temperature.   ------------------------------- Marcial Pacas, M.D.  [facility] [place], [place] [address] Tel: [phone] Fax: [phone]        [facility]    Nerve / Sites Muscle Latency Ref. Amplitude Ref. Rel Amp Segments Distance Velocity Ref. Area    ms ms mV mV %  cm m/s m/s mVms  L Peroneal - EDB     Ankle EDB 4.4 ?6.5 7.7 ?2.0 100 Ankle - EDB 9   20.9     Fib head EDB 12.8  6.6  85.6 Fib head - Ankle 33 39 ?44 23.6     Pop fossa EDB 15.6  6.4  96.1 Pop fossa - Fib head 10 36 ?44 24.7         Pop fossa - Ankle      R Peroneal - EDB     Ankle EDB 5.3 ?6.5 8.6 ?2.0 100 Ankle - EDB 9   27.2     Fib head EDB 14.6  7.8  90.8 Fib head - Ankle 33 35 ?44 26.3     Pop fossa EDB 17.6  6.9  88.2 Pop fossa - Fib  head 10 34 ?44 24.5         Pop fossa - Ankle      L Tibial - AH     Ankle AH 4.1 ?5.8 8.1 ?4.0 100 Ankle - AH 9   22.7     Pop fossa AH 15.4  5.7  70.5 Pop fossa - Ankle 40 35 ?41 17.0  R Tibial - AH     Ankle AH 5.4 ?5.8 8.1 ?4.0 100 Ankle - AH 9   23.1     Pop fossa AH 17.3  4.0  49.7 Pop fossa - Ankle 40 34 ?41 18.0             SNC    Nerve / Sites Rec. Site Peak Lat Ref.  Amp Ref. Segments Distance    ms ms V V  cm  L Sural - Ankle (Calf)     Calf Ankle 3.1 ?4.4 8 ?6 Calf - Ankle 14  R Sural - Ankle (Calf)     Calf Ankle 4.1 ?4.4 8 ?6 Calf - Ankle 14  L Superficial peroneal - Ankle     Lat leg Ankle 4.2 ?4.4 7 ?6 Lat leg - Ankle 14  R Superficial peroneal - Ankle     Lat leg Ankle  4.2 ?4.4 7 ?6 Lat leg - Ankle 14             F  Wave    Nerve F Lat Ref.   ms ms  L Tibial - AH 60.0 ?56.0  R Tibial - AH 55.6 ?56.0  R Peroneal - EDB 56.4 ?56.0  L Peroneal - EDB 56.5 ?56.0             EMG full       EMG Summary Table    Spontaneous MUAP Recruitment  Muscle IA Fib PSW Fasc Other Amp Dur. Poly Pattern  R. Tibialis anterior Normal None None None _______ Normal Normal Normal Normal  R. Gastrocnemius (Lateral head) Normal None None None _______ Normal Normal Normal Normal  R. Vastus lateralis Normal None None None _______ Normal Normal Normal Normal  R. Peroneus longus Normal None None None _______ Normal Normal Normal Normal  R. First dorsal interosseous Normal None None None _______ Normal Normal Normal Normal  R. Pronator teres Normal None None None _______ Normal Normal Normal Normal  R. Triceps brachii Normal None None None _______ Normal Normal Normal Normal  R. Deltoid Normal None None None _______ Normal Normal Normal Normal  R. Biceps brachii Normal None None None _______ Normal Normal Normal Normal  R. Lumbar paraspinals (mid) Normal None None None _______ Normal Normal Normal Normal  R. Lumbar paraspinals (low) Normal None None None _______ Normal Normal Normal  Normal  R. Cervical paraspinals Normal None None None _______ Normal Normal Normal Normal

## 2017-12-08 ENCOUNTER — Other Ambulatory Visit: Payer: Self-pay

## 2017-12-08 ENCOUNTER — Encounter (HOSPITAL_COMMUNITY): Payer: Self-pay | Admitting: Emergency Medicine

## 2017-12-08 DIAGNOSIS — Z79899 Other long term (current) drug therapy: Secondary | ICD-10-CM | POA: Diagnosis not present

## 2017-12-08 DIAGNOSIS — K5730 Diverticulosis of large intestine without perforation or abscess without bleeding: Secondary | ICD-10-CM | POA: Diagnosis not present

## 2017-12-08 DIAGNOSIS — I10 Essential (primary) hypertension: Secondary | ICD-10-CM | POA: Insufficient documentation

## 2017-12-08 DIAGNOSIS — Z7982 Long term (current) use of aspirin: Secondary | ICD-10-CM | POA: Insufficient documentation

## 2017-12-08 DIAGNOSIS — R1031 Right lower quadrant pain: Secondary | ICD-10-CM | POA: Diagnosis present

## 2017-12-08 LAB — COMPREHENSIVE METABOLIC PANEL
ALBUMIN: 4.9 g/dL (ref 3.5–5.0)
ALK PHOS: 63 U/L (ref 38–126)
ALT: 38 U/L (ref 17–63)
AST: 25 U/L (ref 15–41)
Anion gap: 10 (ref 5–15)
BILIRUBIN TOTAL: 1.7 mg/dL — AB (ref 0.3–1.2)
BUN: 32 mg/dL — AB (ref 6–20)
CALCIUM: 9.5 mg/dL (ref 8.9–10.3)
CO2: 26 mmol/L (ref 22–32)
Chloride: 103 mmol/L (ref 101–111)
Creatinine, Ser: 1.01 mg/dL (ref 0.61–1.24)
GFR calc Af Amer: >60 mL/min (ref >60)
GLUCOSE: 129 mg/dL — AB (ref 65–99)
Potassium: 4 mmol/L (ref 3.5–5.1)
Sodium: 139 mmol/L (ref 135–145)
TOTAL PROTEIN: 8.1 g/dL (ref 6.5–8.1)

## 2017-12-08 LAB — CBC
HCT: 48.1 % (ref 39.0–52.0)
Hemoglobin: 17.5 g/dL — ABNORMAL HIGH (ref 13.0–17.0)
MCH: 31.1 pg (ref 26.0–34.0)
MCHC: 36.4 g/dL — AB (ref 30.0–36.0)
MCV: 85.6 fL (ref 78.0–100.0)
Platelets: 168 10*3/uL (ref 150–400)
RBC: 5.62 MIL/uL (ref 4.22–5.81)
RDW: 12.9 % (ref 11.5–15.5)
WBC: 13.8 10*3/uL — AB (ref 4.0–10.5)

## 2017-12-08 LAB — LIPASE, BLOOD: Lipase: 27 U/L (ref 11–51)

## 2017-12-08 NOTE — ED Triage Notes (Signed)
Pt is c/o right lower quadrant pain that started last night around 1130pm  Pt states it woke him up around 2am and he could not go back to sleep because he could not get comfortable  States he took ibuprofen this morning around 0830 and again at 1530  Pt went to [facility] in clinic this evening and was sent her to rule out appendicitis

## 2017-12-09 ENCOUNTER — Emergency Department (HOSPITAL_COMMUNITY): Payer: 59

## 2017-12-09 ENCOUNTER — Emergency Department (HOSPITAL_COMMUNITY)
Admission: EM | Admit: 2017-12-09 | Discharge: 2017-12-09 | Disposition: A | Discharge status: Home / Self Care | Payer: 59 | Attending: Emergency Medicine | Admitting: Emergency Medicine

## 2017-12-09 ENCOUNTER — Encounter (HOSPITAL_COMMUNITY): Payer: Self-pay

## 2017-12-09 DIAGNOSIS — K5792 Diverticulitis of intestine, part unspecified, without perforation or abscess without bleeding: Secondary | ICD-10-CM

## 2017-12-09 HISTORY — DX: Diverticulitis of intestine, part unspecified, without perforation or abscess without bleeding: K57.92

## 2017-12-09 LAB — URINALYSIS, ROUTINE W REFLEX MICROSCOPIC
BILIRUBIN URINE: NEGATIVE
Bacteria, UA: NONE SEEN
Glucose, UA: NEGATIVE mg/dL
HGB URINE DIPSTICK: NEGATIVE
Ketones, ur: 5 mg/dL — AB
Leukocytes, UA: NEGATIVE
NITRITE: NEGATIVE
PH: 5 (ref 5.0–8.0)
Protein, ur: NEGATIVE mg/dL
SPECIFIC GRAVITY, URINE: 1.029 (ref 1.005–1.030)

## 2017-12-09 LAB — I-STAT CG4 LACTIC ACID, ED: Lactic Acid, Venous: 0.89 mmol/L (ref 0.5–1.9)

## 2017-12-09 MED ORDER — KETOROLAC TROMETHAMINE 30 MG/ML IJ SOLN
30.0000 mg | Freq: Once | INTRAMUSCULAR | Status: AC
  Administered 2017-12-09: 30 mg via INTRAVENOUS
  Filled 2017-12-09: qty 1

## 2017-12-09 MED ORDER — CIPROFLOXACIN IN D5W 400 MG/200ML IV SOLN
400.0000 mg | Freq: Once | INTRAVENOUS | Status: AC
  Administered 2017-12-09: 400 mg via INTRAVENOUS
  Filled 2017-12-09: qty 200

## 2017-12-09 MED ORDER — CIPROFLOXACIN HCL 500 MG PO TABS: 500.0000 mg | ORAL_TABLET | Freq: Two times a day (BID) | ORAL | 0 refills | Status: AC

## 2017-12-09 MED ORDER — IOPAMIDOL (ISOVUE-300) INJECTION 61%
INTRAVENOUS | Status: AC
  Filled 2017-12-09: qty 100

## 2017-12-09 MED ORDER — ACETAMINOPHEN 500 MG PO TABS
1000.0000 mg | ORAL_TABLET | Freq: Once | ORAL | Status: DC
  Filled 2017-12-09: qty 2

## 2017-12-09 MED ORDER — METRONIDAZOLE 500 MG PO TABS: 500.0000 mg | ORAL_TABLET | Freq: Three times a day (TID) | ORAL | 0 refills | Status: AC

## 2017-12-09 MED ORDER — IOPAMIDOL (ISOVUE-300) INJECTION 61%
100.0000 mL | Freq: Once | INTRAVENOUS | Status: AC | PRN
  Administered 2017-12-09: 100 mL via INTRAVENOUS

## 2017-12-09 MED ORDER — SODIUM CHLORIDE 0.9 % IV BOLUS
1000.0000 mL | Freq: Once | INTRAVENOUS | Status: AC
  Administered 2017-12-09: 1000 mL via INTRAVENOUS

## 2017-12-09 MED ORDER — METRONIDAZOLE IN NACL 5-0.79 MG/ML-% IV SOLN
500.0000 mg | Freq: Once | INTRAVENOUS | Status: AC
  Administered 2017-12-09: 500 mg via INTRAVENOUS
  Filled 2017-12-09: qty 100

## 2017-12-09 NOTE — Discharge Instructions (Signed)
Your work-up in the emergency department showed diverticulitis.  There is no evidence of abscess or perforation, though there is significant amount of inflammation around your colon.  This is the cause of your discomfort.  We have discussed inpatient versus outpatient management.  You have elected to continue with management of your diverticulitis at home with antibiotics.  You were given a dose of antibiotics through your IV in the ED.  You have been placed on ciprofloxacin and Flagyl to take at home for management of diverticulitis.  We recommend that you take these medications as prescribed until finished.  You may use Tylenol as needed for pain.  We strongly recommend follow-up with your primary care doctor later today or on Monday for repeat evaluation.  Should you develop worsening symptoms, such as severe worsening of your pain, fever over 101F, vomiting with inability to keep down food or fluids, or any other concerning process, promptly return to the emergency department for repeat evaluation.

## 2017-12-09 NOTE — ED Notes (Signed)
Pt ambulatory to restroom

## 2017-12-09 NOTE — ED Notes (Signed)
Introduced self to pt. Obtained labs. Updated white board

## 2017-12-09 NOTE — ED Provider Notes (Signed)
[facility] DEPT Provider Note   CSN: [id number] Arrival date & time: 12/08/17  2000    History   Chief Complaint Chief Complaint  Patient presents with  . Abdominal Pain    HPI DAROLD MILEY is a 67 y.o. male.  67 year old male presents to the emergency department for evaluation of right lower quadrant pain.  He has had the pain since 2 AM yesterday.  He reports the pain has been constant and has gradually worsened.  He was unable to sleep last night secondary to worsening discomfort, though he had some improvement to his pain with ibuprofen this morning.  He took ibuprofen again at 1530, but this provided little relief.  Patient sought treatment at [facility] walk-in clinic.  They were concerned about appendicitis and sent the patient here.  He has had some nausea and anorexia without vomiting.  He had a small bowel movement yesterday.  No history of abdominal surgeries.  No associated fevers.  Patient denies hematuria, dysuria.  No history of kidney stones.     Past Medical History:  Diagnosis Date  . Diverticulitis   . Hyperlipemia   . Hypertension   . Paresthesia   . Prediabetes   . PVC (premature ventricular contraction)   . Thrombocytopenia [facility])     Patient Active Problem List   Diagnosis Date Noted  . Paresthesia 09/07/2016  . Low back pain 09/07/2016  . Weakness of left foot 09/07/2016  . Neck pain 09/07/2016  . Lipoma of breast-removed March 2014 09/29/2012    Past Surgical History:  Procedure Laterality Date  . CYST EXCISION     x2  . LIPOMA EXCISION Left 09/13/2012   Procedure: Excision of Left Pectoral Mass-- Lipoma;  Surgeon: Pedro Earls, MD;  Location: [facility];  Service: General;  Laterality: Left;  . TONSILLECTOMY     as child        Home Medications    Prior to Admission medications   Medication Sig Start Date End Date Taking? Authorizing Provider  aspirin 81 MG tablet Take 81 mg by mouth  daily.   Yes [provider]  atorvastatin (LIPITOR) 20 MG tablet Take 20 mg by mouth daily.  08/21/12  Yes [provider]  fish oil-omega-3 fatty acids 1000 MG capsule Take 2 g by mouth daily.   Yes [provider]  MICARDIS HCT 80-12.5 MG per tablet  08/21/12  Yes [provider]  multivitamin-lutein (OCUVITE-LUTEIN) CAPS capsule Take 1 capsule by mouth daily.   Yes [provider]  ciprofloxacin (CIPRO) 500 MG tablet Take 1 tablet (500 mg total) by mouth 2 (two) times daily. 12/09/17   Antonietta Breach, PA-C  metroNIDAZOLE (FLAGYL) 500 MG tablet Take 1 tablet (500 mg total) by mouth 3 (three) times daily. 12/09/17   Antonietta Breach, PA-C    Family History Family History  Problem Relation Age of Onset  . Heart disease Mother   . Hypertension Mother   . Lung cancer Mother   . Heart disease Father   . Hypertension Father   . Diabetes Father   . Congestive Heart Failure Father   . Prostate cancer Father   . Migraines Sister   . Bladder Cancer Sister   . Heart attack Maternal Grandfather     Social History Social History   Tobacco Use  . Smoking status: Never Smoker  . Smokeless tobacco: Never Used  Substance Use Topics  . Alcohol use: Yes    Comment:  3 times a week  . Drug use: No     Allergies   Patient has no known allergies.   Review of Systems Review of Systems Ten systems reviewed and are negative for acute change, except as noted in the HPI.    Physical Exam Updated Vital Signs BP 124/74   Pulse (!) 107   Temp 99.1 F (37.3 C) (Oral)   Resp 14   SpO2 93%   Physical Exam  Constitutional: He is oriented to person, place, and time. He appears well-developed and well-nourished. No distress.  Nontoxic appearing and in NAD  HENT:  Head: Normocephalic and atraumatic.  Eyes: Conjunctivae and EOM are normal. No scleral icterus.  Neck: Normal range of motion.  Cardiovascular: Normal rate, regular rhythm and intact distal  pulses.  Pulmonary/Chest: Effort normal. No stridor. No respiratory distress. He has no wheezes.  Respirations even and unlabored  Abdominal: Soft. There is tenderness. There is guarding (voluntary).  Focal right lower quadrant tenderness with voluntary guarding.  No peritoneal signs.  Musculoskeletal: Normal range of motion.  Neurological: He is alert and oriented to person, place, and time. He exhibits normal muscle tone. Coordination normal.  GCS 15.  Moving all extremities.  Skin: Skin is warm and dry. No rash noted. He is not diaphoretic. No erythema. No pallor.  Psychiatric: He has a normal mood and affect. His behavior is normal.  Nursing note and vitals reviewed.    ED Treatments / Results  Labs (all labs ordered are listed, but only abnormal results are displayed) Labs Reviewed  COMPREHENSIVE METABOLIC PANEL - Abnormal; Notable for the following components:      Result Value   Glucose, Bld 129 (*)    BUN 32 (*)    Total Bilirubin 1.7 (*)    All other components within normal limits  CBC - Abnormal; Notable for the following components:   WBC 13.8 (*)    Hemoglobin 17.5 (*)    MCHC 36.4 (*)    All other components within normal limits  URINALYSIS, ROUTINE W REFLEX MICROSCOPIC - Abnormal; Notable for the following components:   Ketones, ur 5 (*)    All other components within normal limits  LIPASE, BLOOD  I-STAT CG4 LACTIC ACID, ED    EKG None  Radiology Ct Abdomen Pelvis W Contrast  Result Date: 12/09/2017 CLINICAL DATA:  67 y/o M; 2 days of right lower quadrant abdominal pain. History of hypertension and diverticulitis. EXAM: CT ABDOMEN AND PELVIS WITH CONTRAST TECHNIQUE: Multidetector CT imaging of the abdomen and pelvis was performed using the standard protocol following bolus administration of intravenous contrast. CONTRAST:  188mL ISOVUE-300 IOPAMIDOL (ISOVUE-300) INJECTION 61% COMPARISON:  None. FINDINGS: Lower chest: No acute abnormality. Hepatobiliary: No  focal liver abnormality is seen. No gallstones, gallbladder wall thickening, or biliary dilatation. Pancreas: Unremarkable. No pancreatic ductal dilatation or surrounding inflammatory changes. Spleen: Normal in size without focal abnormality. Adrenals/Urinary Tract: Adrenal glands are unremarkable. Kidneys are normal, without renal calculi, focal lesion, or hydronephrosis. Bladder is unremarkable. Stomach/Bowel: Acute sigmoid diverticulitis with extensive surrounding inflammation. No perforation or discrete pericolonic abscess identified. Extensive sigmoid diverticulosis. Normal appendix. No obstructive changes of the small or large bowel. Normal stomach. Vascular/Lymphatic: Aortic atherosclerosis. No enlarged abdominal or pelvic lymph nodes. Reproductive: Moderate prostate enlargement. Other: Small volume of fluid within the pelvis and right lower quadrant. Musculoskeletal: No fracture is seen. IMPRESSION: 1. Acute sigmoid diverticulitis with extensive surrounding inflammation. Small volume of fluid in the right lower quadrant and pelvis.  No perforation or discrete abscess identified. 2. Aortic atherosclerosis. 3. Moderate prostate enlargement. Electronically Signed   By: Kristine Garbe M.D.   On: 12/09/2017 04:34    Procedures Procedures (including critical care time)  Medications Ordered in ED Medications  iopamidol (ISOVUE-300) 61 % injection (has no administration in time range)  sodium chloride 0.9 % bolus 1,000 mL (0 mLs Intravenous Stopped 12/09/17 0449)  iopamidol (ISOVUE-300) 61 % injection 100 mL (100 mLs Intravenous Contrast Given 12/09/17 0408)  ciprofloxacin (CIPRO) IVPB 400 mg (0 mg Intravenous Stopped 12/09/17 0600)  metroNIDAZOLE (FLAGYL) IVPB 500 mg (0 mg Intravenous Stopped 12/09/17 0703)  ketorolac (TORADOL) 30 MG/ML injection 30 mg (30 mg Intravenous Given 12/09/17 0630)  sodium chloride 0.9 % bolus 1,000 mL (0 mLs Intravenous Stopped 12/09/17 0639)    7:36 AM Patient  reassessed.  He continues to remain well-appearing.  He has had no clinical decompensation.  Vital signs stable.  Lactate in the emergency department is normal at 0.89.  Have discussed admission versus outpatient management and patient elects to be discharged home.  Will continue ciprofloxacin and Flagyl for management of diverticulitis.  The patient has been advised to promptly return to the ED for any new or concerning symptoms.  That patient has been seen and evaluated by my attending, Dr. Florina Ou, who is agreeable with work up and plan.    Initial Impression / Assessment and Plan / ED Course  I have reviewed the triage vital signs and the nursing notes.  Pertinent labs & imaging results that were available during my care of the patient were reviewed by me and considered in my medical decision making (see chart for details).     67 year old male presents the emergency department for evaluation of right lower quadrant pain.  He has been afebrile in the emergency department with intermittent tachycardia.  Laboratory work-up notable for leukocytosis of 13.8.  This corresponds with findings of diverticulitis on abdominal CT scan.  Appendix appears normal on imaging.  No evidence of abscess or perforation at this time.  Patient treated in the ED with IV ciprofloxacin and Flagyl.  He has been hydrated with 3 L of IV fluids.  I have discussed inpatient versus outpatient management with the patient.  He states that he would like to try managing his symptoms further at home; declines admission at this time.  Patient given prescription for ciprofloxacin and Flagyl.  He has been instructed to follow-up with his primary care doctor for repeat evaluation.  Strict return precautions discussed and provided.  Patient discharged in stable condition with no unaddressed concerns.   Final Clinical Impressions(s) / ED Diagnoses   Final diagnoses:  Diverticulitis    ED Discharge Orders        Ordered     ciprofloxacin (CIPRO) 500 MG tablet  2 times daily     12/09/17 0730    metroNIDAZOLE (FLAGYL) 500 MG tablet  3 times daily     12/09/17 0730       Antonietta Breach, PA-C 12/17/17 0550    Shanon Rosser, MD 12/22/17 2235

## 2018-09-05 DIAGNOSIS — Z1211 Encounter for screening for malignant neoplasm of colon: Secondary | ICD-10-CM | POA: Diagnosis not present

## 2018-09-05 DIAGNOSIS — R202 Paresthesia of skin: Secondary | ICD-10-CM | POA: Diagnosis not present

## 2018-09-05 DIAGNOSIS — I493 Ventricular premature depolarization: Secondary | ICD-10-CM | POA: Diagnosis not present

## 2018-09-05 DIAGNOSIS — Z8719 Personal history of other diseases of the digestive system: Secondary | ICD-10-CM | POA: Diagnosis not present

## 2018-09-05 DIAGNOSIS — I10 Essential (primary) hypertension: Secondary | ICD-10-CM | POA: Diagnosis not present

## 2018-09-05 DIAGNOSIS — L723 Sebaceous cyst: Secondary | ICD-10-CM | POA: Diagnosis not present

## 2018-09-05 DIAGNOSIS — Z125 Encounter for screening for malignant neoplasm of prostate: Secondary | ICD-10-CM | POA: Diagnosis not present

## 2018-09-05 DIAGNOSIS — E782 Mixed hyperlipidemia: Secondary | ICD-10-CM | POA: Diagnosis not present

## 2018-09-05 DIAGNOSIS — Z0000 Encounter for general adult medical examination without abnormal findings: Secondary | ICD-10-CM | POA: Diagnosis not present

## 2018-09-05 DIAGNOSIS — R7303 Prediabetes: Secondary | ICD-10-CM | POA: Diagnosis not present

## 2018-09-08 IMAGING — CT CT ABD-PELV W/ CM
2 of 5 series · 16 of 46 positions shown, 18 images · IV contrast (ISOVUE)
Comparison: None.

CLINICAL DATA: 66 y/o M; 2 days of right lower quadrant abdominal
pain. History of hypertension and diverticulitis.

EXAM:
CT ABDOMEN AND PELVIS WITH CONTRAST
TECHNIQUE: Multidetector CT imaging of the abdomen and pelvis was performed
using the standard protocol following bolus administration of
intravenous contrast.
CONTRAST:  100mL W21HFH-5ZZ IOPAMIDOL (W21HFH-5ZZ) INJECTION 61%

[Series 2: axial st · axial · 0.79mm/px · z∈[-486,-86]mm · 13 of 92 slices shown, 15 images]
[im 6/92  soft-tissue]
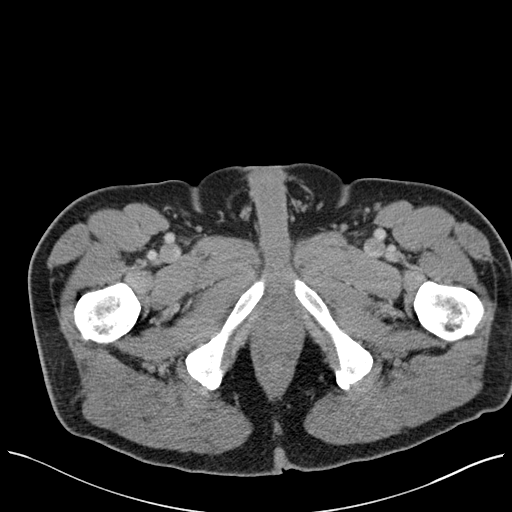
[im 6/92  bone]
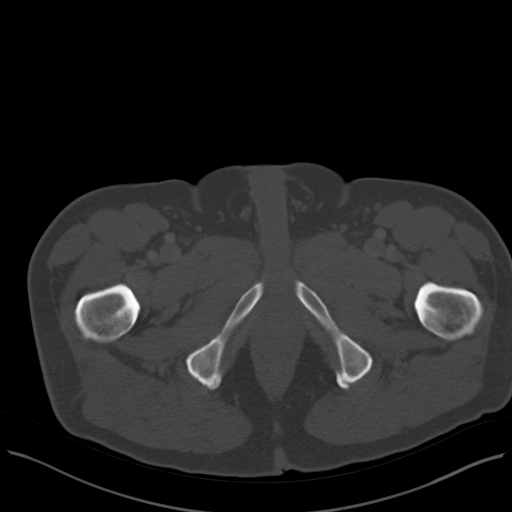
[im 11/92  soft-tissue]
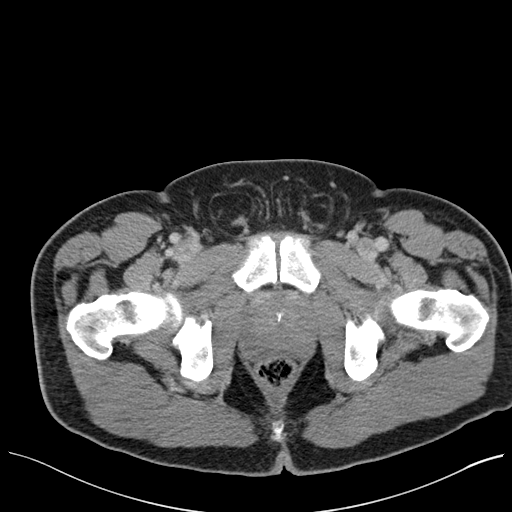
[im 22/92  soft-tissue]
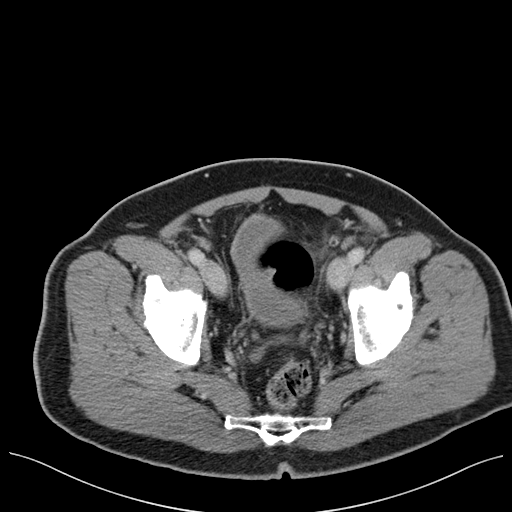
[im 27/92  soft-tissue]
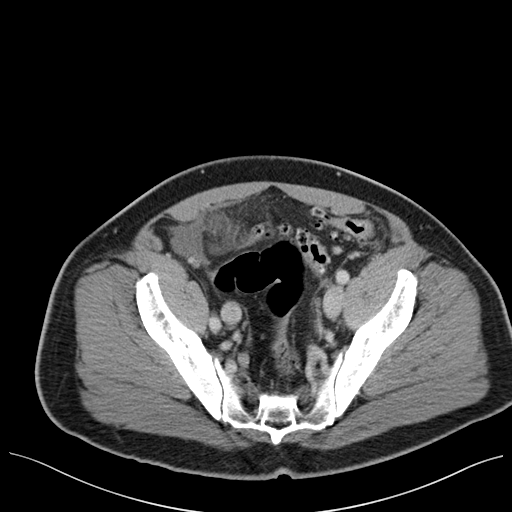
[im 33/92  soft-tissue]
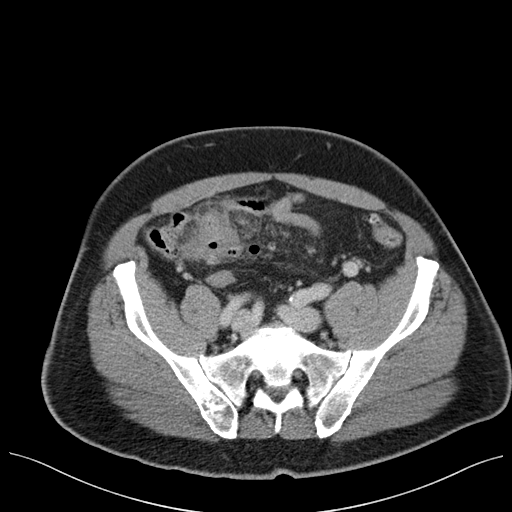
[im 38/92  soft-tissue]
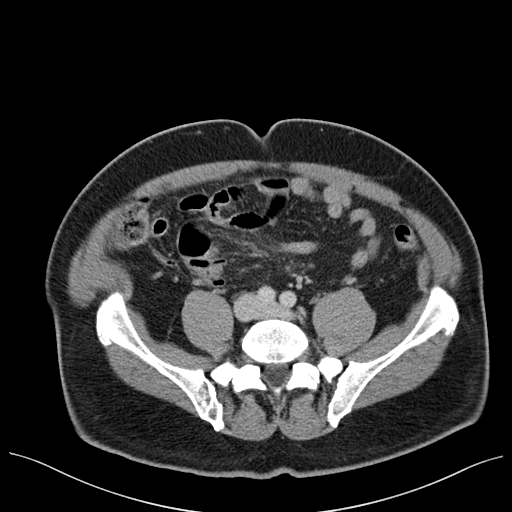
[im 49/92  soft-tissue]
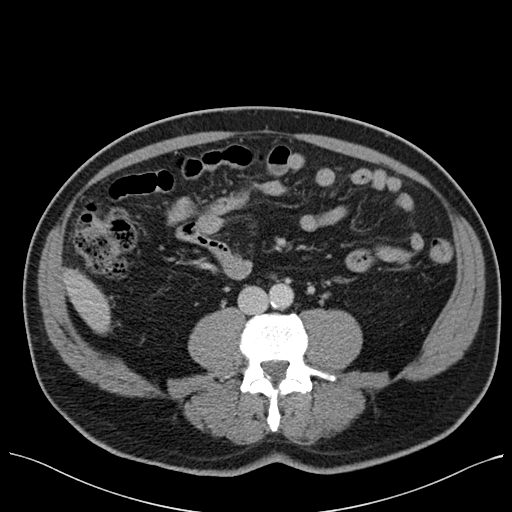
[im 54/92  soft-tissue]
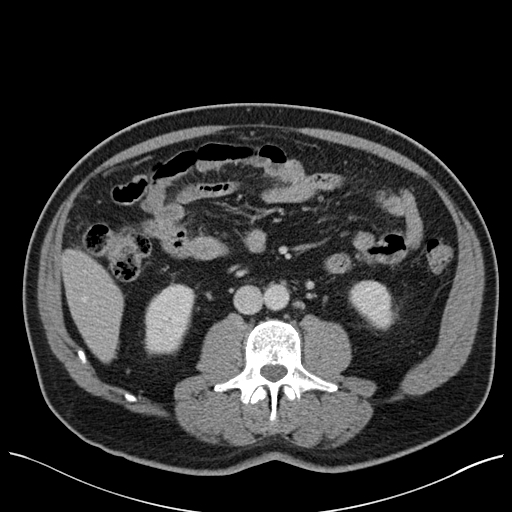
[im 59/92  soft-tissue]
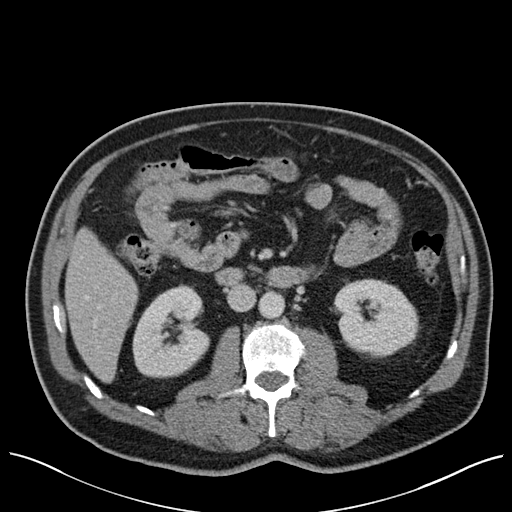
[im 59/92  bone]
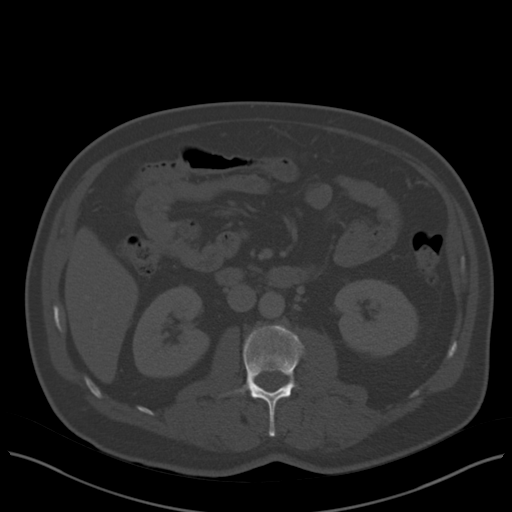
[im 65/92  soft-tissue]
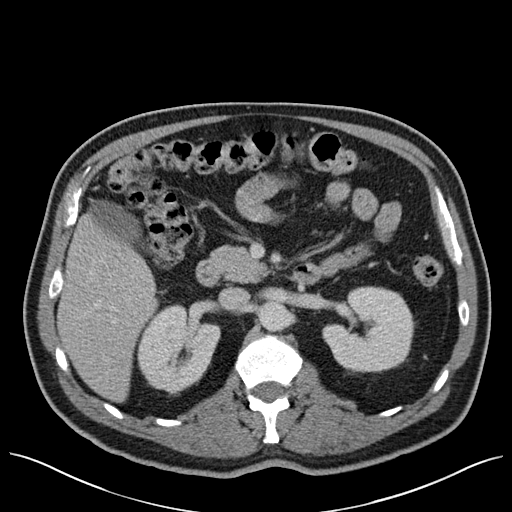
[im 70/92  soft-tissue]
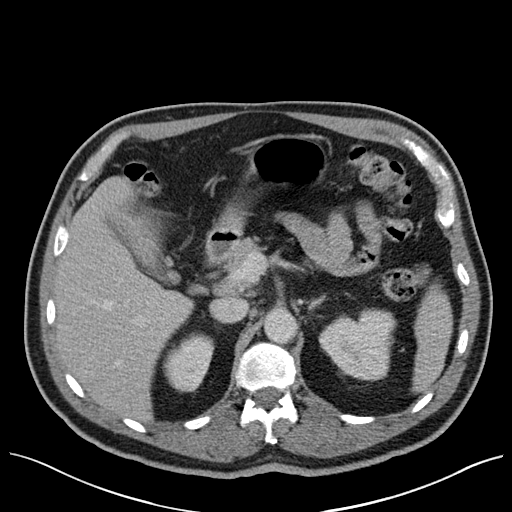
[im 81/92  soft-tissue]
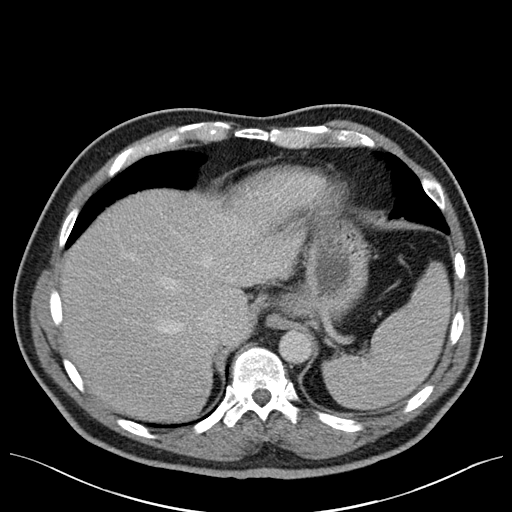
[im 86/92  soft-tissue]
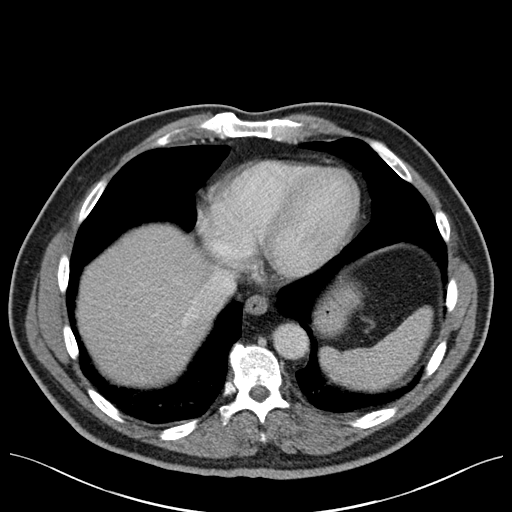

[Series 5: coronal st · coronal · 0.72mm/px · 3 of 101 slices shown]
[im 34/101  soft-tissue]
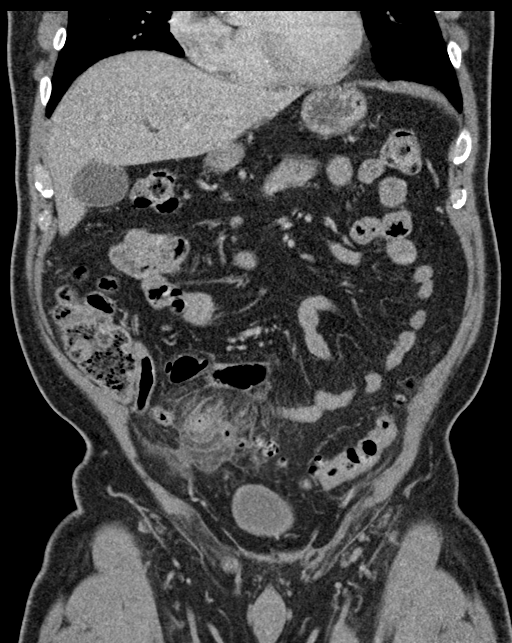
[im 45/101  soft-tissue]
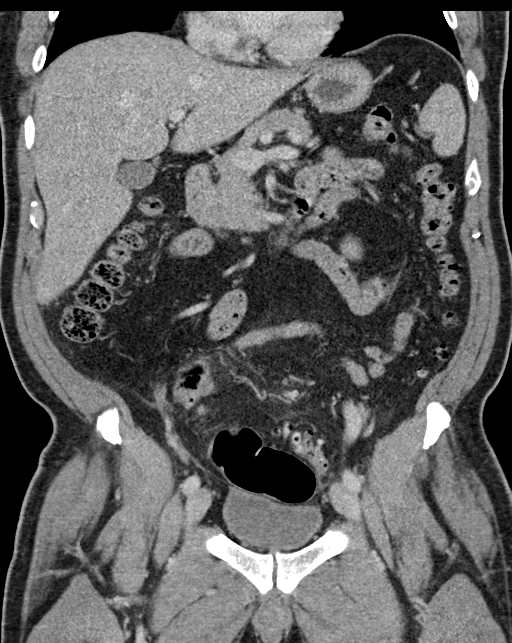
[im 56/101  soft-tissue]
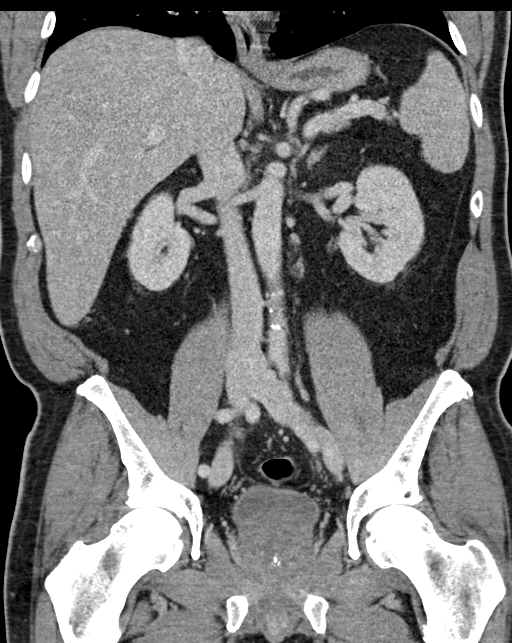

[16 of 46 positions shown; findings below may reference images not displayed]

FINDINGS: Lower chest: No acute abnormality.

Hepatobiliary: No focal liver abnormality is seen. No gallstones,
gallbladder wall thickening, or biliary dilatation.

Pancreas: Unremarkable. No pancreatic ductal dilatation or
surrounding inflammatory changes.

Spleen: Normal in size without focal abnormality.

Adrenals/Urinary Tract: Adrenal glands are unremarkable. Kidneys are
normal, without renal calculi, focal lesion, or hydronephrosis.
Bladder is unremarkable.

Stomach/Bowel: Acute sigmoid diverticulitis with extensive
surrounding inflammation. No perforation or discrete pericolonic
abscess identified. Extensive sigmoid diverticulosis. Normal
appendix. No obstructive changes of the small or large bowel. Normal
stomach.

Vascular/Lymphatic: Aortic atherosclerosis. No enlarged abdominal or
pelvic lymph nodes.

Reproductive: Moderate prostate enlargement.

Other: Small volume of fluid within the pelvis and right lower
quadrant.

Musculoskeletal: No fracture is seen.
IMPRESSION: 1. Acute sigmoid diverticulitis with extensive surrounding
inflammation. Small volume of fluid in the right lower quadrant and
pelvis. No perforation or discrete abscess identified.
2. Aortic atherosclerosis.
3. Moderate prostate enlargement.

By: Bany Kobayashi M.D.

## 2018-11-16 DIAGNOSIS — M25511 Pain in right shoulder: Secondary | ICD-10-CM | POA: Diagnosis not present

## 2018-11-21 DIAGNOSIS — M47892 Other spondylosis, cervical region: Secondary | ICD-10-CM | POA: Diagnosis not present

## 2018-11-21 DIAGNOSIS — M542 Cervicalgia: Secondary | ICD-10-CM | POA: Diagnosis not present

## 2018-11-21 DIAGNOSIS — M5412 Radiculopathy, cervical region: Secondary | ICD-10-CM | POA: Diagnosis not present

## 2018-11-23 DIAGNOSIS — M542 Cervicalgia: Secondary | ICD-10-CM | POA: Diagnosis not present

## 2018-11-23 DIAGNOSIS — M5412 Radiculopathy, cervical region: Secondary | ICD-10-CM | POA: Diagnosis not present

## 2018-11-28 DIAGNOSIS — M542 Cervicalgia: Secondary | ICD-10-CM | POA: Diagnosis not present

## 2018-11-28 DIAGNOSIS — M5412 Radiculopathy, cervical region: Secondary | ICD-10-CM | POA: Diagnosis not present

## 2018-11-30 DIAGNOSIS — M542 Cervicalgia: Secondary | ICD-10-CM | POA: Diagnosis not present

## 2018-11-30 DIAGNOSIS — M5412 Radiculopathy, cervical region: Secondary | ICD-10-CM | POA: Diagnosis not present

## 2018-12-05 DIAGNOSIS — M5412 Radiculopathy, cervical region: Secondary | ICD-10-CM | POA: Diagnosis not present

## 2018-12-05 DIAGNOSIS — M542 Cervicalgia: Secondary | ICD-10-CM | POA: Diagnosis not present

## 2018-12-07 DIAGNOSIS — M542 Cervicalgia: Secondary | ICD-10-CM | POA: Diagnosis not present

## 2018-12-07 DIAGNOSIS — M5412 Radiculopathy, cervical region: Secondary | ICD-10-CM | POA: Diagnosis not present

## 2018-12-12 DIAGNOSIS — M5412 Radiculopathy, cervical region: Secondary | ICD-10-CM | POA: Diagnosis not present

## 2018-12-12 DIAGNOSIS — M542 Cervicalgia: Secondary | ICD-10-CM | POA: Diagnosis not present

## 2018-12-14 DIAGNOSIS — M5412 Radiculopathy, cervical region: Secondary | ICD-10-CM | POA: Diagnosis not present

## 2018-12-14 DIAGNOSIS — M542 Cervicalgia: Secondary | ICD-10-CM | POA: Diagnosis not present

## 2018-12-21 DIAGNOSIS — M542 Cervicalgia: Secondary | ICD-10-CM | POA: Diagnosis not present

## 2018-12-21 DIAGNOSIS — M4692 Unspecified inflammatory spondylopathy, cervical region: Secondary | ICD-10-CM | POA: Diagnosis not present

## 2018-12-21 DIAGNOSIS — M4722 Other spondylosis with radiculopathy, cervical region: Secondary | ICD-10-CM | POA: Diagnosis not present

## 2019-01-11 DIAGNOSIS — M4722 Other spondylosis with radiculopathy, cervical region: Secondary | ICD-10-CM | POA: Diagnosis not present

## 2019-01-11 DIAGNOSIS — M130 Polyarthritis, unspecified: Secondary | ICD-10-CM | POA: Diagnosis not present

## 2019-01-11 DIAGNOSIS — M4692 Unspecified inflammatory spondylopathy, cervical region: Secondary | ICD-10-CM | POA: Diagnosis not present

## 2019-01-11 DIAGNOSIS — M436 Torticollis: Secondary | ICD-10-CM | POA: Diagnosis not present

## 2019-01-18 DIAGNOSIS — M542 Cervicalgia: Secondary | ICD-10-CM | POA: Diagnosis not present

## 2019-01-25 DIAGNOSIS — M4722 Other spondylosis with radiculopathy, cervical region: Secondary | ICD-10-CM | POA: Diagnosis not present

## 2019-01-25 DIAGNOSIS — M542 Cervicalgia: Secondary | ICD-10-CM | POA: Diagnosis not present

## 2019-02-12 DIAGNOSIS — M5412 Radiculopathy, cervical region: Secondary | ICD-10-CM | POA: Diagnosis not present

## 2019-03-13 DIAGNOSIS — M5412 Radiculopathy, cervical region: Secondary | ICD-10-CM | POA: Diagnosis not present

## 2019-03-30 DIAGNOSIS — M5412 Radiculopathy, cervical region: Secondary | ICD-10-CM | POA: Diagnosis not present

## 2019-04-02 DIAGNOSIS — R69 Illness, unspecified: Secondary | ICD-10-CM | POA: Diagnosis not present

## 2019-04-03 DIAGNOSIS — M5412 Radiculopathy, cervical region: Secondary | ICD-10-CM | POA: Diagnosis not present

## 2019-04-03 DIAGNOSIS — M542 Cervicalgia: Secondary | ICD-10-CM | POA: Diagnosis not present

## 2019-05-03 DIAGNOSIS — M542 Cervicalgia: Secondary | ICD-10-CM | POA: Diagnosis not present

## 2019-05-03 DIAGNOSIS — M5412 Radiculopathy, cervical region: Secondary | ICD-10-CM | POA: Diagnosis not present

## 2019-06-03 DIAGNOSIS — M542 Cervicalgia: Secondary | ICD-10-CM | POA: Diagnosis not present

## 2019-06-03 DIAGNOSIS — M5412 Radiculopathy, cervical region: Secondary | ICD-10-CM | POA: Diagnosis not present

## 2019-06-20 DIAGNOSIS — Z7722 Contact with and (suspected) exposure to environmental tobacco smoke (acute) (chronic): Secondary | ICD-10-CM | POA: Diagnosis not present

## 2019-06-20 DIAGNOSIS — Z809 Family history of malignant neoplasm, unspecified: Secondary | ICD-10-CM | POA: Diagnosis not present

## 2019-06-20 DIAGNOSIS — I10 Essential (primary) hypertension: Secondary | ICD-10-CM | POA: Diagnosis not present

## 2019-06-20 DIAGNOSIS — E669 Obesity, unspecified: Secondary | ICD-10-CM | POA: Diagnosis not present

## 2019-06-20 DIAGNOSIS — Z7982 Long term (current) use of aspirin: Secondary | ICD-10-CM | POA: Diagnosis not present

## 2019-06-20 DIAGNOSIS — Z833 Family history of diabetes mellitus: Secondary | ICD-10-CM | POA: Diagnosis not present

## 2019-06-20 DIAGNOSIS — Z8249 Family history of ischemic heart disease and other diseases of the circulatory system: Secondary | ICD-10-CM | POA: Diagnosis not present

## 2019-06-20 DIAGNOSIS — E785 Hyperlipidemia, unspecified: Secondary | ICD-10-CM | POA: Diagnosis not present

## 2019-06-20 DIAGNOSIS — Z6831 Body mass index (BMI) 31.0-31.9, adult: Secondary | ICD-10-CM | POA: Diagnosis not present

## 2019-06-26 DIAGNOSIS — M542 Cervicalgia: Secondary | ICD-10-CM | POA: Diagnosis not present

## 2019-06-26 DIAGNOSIS — M5412 Radiculopathy, cervical region: Secondary | ICD-10-CM | POA: Diagnosis not present

## 2019-07-15 DIAGNOSIS — M5412 Radiculopathy, cervical region: Secondary | ICD-10-CM | POA: Diagnosis not present

## 2019-07-15 DIAGNOSIS — M542 Cervicalgia: Secondary | ICD-10-CM | POA: Diagnosis not present

## 2019-08-15 DIAGNOSIS — M5412 Radiculopathy, cervical region: Secondary | ICD-10-CM | POA: Diagnosis not present

## 2019-08-15 DIAGNOSIS — M542 Cervicalgia: Secondary | ICD-10-CM | POA: Diagnosis not present

## 2019-08-16 DIAGNOSIS — R69 Illness, unspecified: Secondary | ICD-10-CM | POA: Diagnosis not present

## 2019-09-02 ENCOUNTER — Ambulatory Visit: Payer: Medicare HMO | Attending: Internal Medicine

## 2019-09-02 DIAGNOSIS — Z23 Encounter for immunization: Secondary | ICD-10-CM

## 2019-09-02 NOTE — Progress Notes (Signed)
   Covid-[facility] Vaccination [facility]  Name:  Matthew Compton    MRN: [id number] DOB: December 17, 1950  09/02/2019  Mr. Matthew Compton was observed post Covid-19 immunization for 15 minutes without incidence. He was provided with Vaccine Information Sheet and instruction to access the V-Safe system.   Mr. Matthew Compton was instructed to call 911 with any severe reactions post vaccine: Marland Kitchen Difficulty breathing  . Swelling of your face and throat  . A fast heartbeat  . A bad rash all over your body  . Dizziness and weakness    Immunizations Administered    Name Date Dose VIS Date Route   Pfizer COVID-19 Vaccine 09/02/2019  8:21 AM 0.3 mL 06/22/2019 Intramuscular   Manufacturer: [place]   Lot: [id number]   [place]: [id number]

## 2019-09-12 DIAGNOSIS — M542 Cervicalgia: Secondary | ICD-10-CM | POA: Diagnosis not present

## 2019-09-12 DIAGNOSIS — M5412 Radiculopathy, cervical region: Secondary | ICD-10-CM | POA: Diagnosis not present

## 2019-09-25 ENCOUNTER — Ambulatory Visit: Payer: Medicare HMO | Attending: Internal Medicine

## 2019-09-25 DIAGNOSIS — Z23 Encounter for immunization: Secondary | ICD-10-CM

## 2019-09-25 NOTE — Progress Notes (Signed)
   Covid-19 Vaccination Clinic  Name:  Matthew Compton    MRN: [id number] DOB: 1951-02-09  09/25/2019  [name] was observed post Covid-19 immunization for 15 minutes without incident. He was provided with Vaccine Information Sheet and instruction to access the V-Safe system.   [name] was instructed to call 911 with any severe reactions post vaccine: Marland Kitchen Difficulty breathing  . Swelling of face and throat  . A fast heartbeat  . A bad rash all over body  . Dizziness and weakness   Immunizations Administered    Name Date Dose VIS Date Route   Pfizer COVID-19 Vaccine 09/25/2019  2:39 PM 0.3 mL 06/22/2019 Intramuscular   Manufacturer: [place]   Lot: [id number]   [place]: [id number]

## 2019-09-27 DIAGNOSIS — Z8719 Personal history of other diseases of the digestive system: Secondary | ICD-10-CM | POA: Diagnosis not present

## 2019-09-27 DIAGNOSIS — R202 Paresthesia of skin: Secondary | ICD-10-CM | POA: Diagnosis not present

## 2019-09-27 DIAGNOSIS — I493 Ventricular premature depolarization: Secondary | ICD-10-CM | POA: Diagnosis not present

## 2019-09-27 DIAGNOSIS — R7303 Prediabetes: Secondary | ICD-10-CM | POA: Diagnosis not present

## 2019-09-27 DIAGNOSIS — Z125 Encounter for screening for malignant neoplasm of prostate: Secondary | ICD-10-CM | POA: Diagnosis not present

## 2019-09-27 DIAGNOSIS — Z0000 Encounter for general adult medical examination without abnormal findings: Secondary | ICD-10-CM | POA: Diagnosis not present

## 2019-09-27 DIAGNOSIS — Z1211 Encounter for screening for malignant neoplasm of colon: Secondary | ICD-10-CM | POA: Diagnosis not present

## 2019-09-27 DIAGNOSIS — E782 Mixed hyperlipidemia: Secondary | ICD-10-CM | POA: Diagnosis not present

## 2019-09-27 DIAGNOSIS — R69 Illness, unspecified: Secondary | ICD-10-CM | POA: Diagnosis not present

## 2019-09-27 DIAGNOSIS — I10 Essential (primary) hypertension: Secondary | ICD-10-CM | POA: Diagnosis not present

## 2019-10-12 DIAGNOSIS — M542 Cervicalgia: Secondary | ICD-10-CM | POA: Diagnosis not present

## 2019-10-12 DIAGNOSIS — M5412 Radiculopathy, cervical region: Secondary | ICD-10-CM | POA: Diagnosis not present

## 2019-10-29 DIAGNOSIS — Z23 Encounter for immunization: Secondary | ICD-10-CM | POA: Diagnosis not present

## 2019-11-11 DIAGNOSIS — M542 Cervicalgia: Secondary | ICD-10-CM | POA: Diagnosis not present

## 2019-11-11 DIAGNOSIS — M5412 Radiculopathy, cervical region: Secondary | ICD-10-CM | POA: Diagnosis not present

## 2019-11-19 DIAGNOSIS — R69 Illness, unspecified: Secondary | ICD-10-CM | POA: Diagnosis not present

## 2019-12-12 DIAGNOSIS — M5412 Radiculopathy, cervical region: Secondary | ICD-10-CM | POA: Diagnosis not present

## 2019-12-12 DIAGNOSIS — M542 Cervicalgia: Secondary | ICD-10-CM | POA: Diagnosis not present

## 2020-01-11 DIAGNOSIS — M542 Cervicalgia: Secondary | ICD-10-CM | POA: Diagnosis not present

## 2020-01-11 DIAGNOSIS — M5412 Radiculopathy, cervical region: Secondary | ICD-10-CM | POA: Diagnosis not present

## 2020-01-22 DIAGNOSIS — Z809 Family history of malignant neoplasm, unspecified: Secondary | ICD-10-CM | POA: Diagnosis not present

## 2020-01-22 DIAGNOSIS — Z008 Encounter for other general examination: Secondary | ICD-10-CM | POA: Diagnosis not present

## 2020-01-22 DIAGNOSIS — Z8249 Family history of ischemic heart disease and other diseases of the circulatory system: Secondary | ICD-10-CM | POA: Diagnosis not present

## 2020-01-22 DIAGNOSIS — Z87891 Personal history of nicotine dependence: Secondary | ICD-10-CM | POA: Diagnosis not present

## 2020-01-22 DIAGNOSIS — Z833 Family history of diabetes mellitus: Secondary | ICD-10-CM | POA: Diagnosis not present

## 2020-01-22 DIAGNOSIS — I10 Essential (primary) hypertension: Secondary | ICD-10-CM | POA: Diagnosis not present

## 2020-01-22 DIAGNOSIS — Z7982 Long term (current) use of aspirin: Secondary | ICD-10-CM | POA: Diagnosis not present

## 2020-01-22 DIAGNOSIS — E785 Hyperlipidemia, unspecified: Secondary | ICD-10-CM | POA: Diagnosis not present

## 2020-02-11 DIAGNOSIS — M5412 Radiculopathy, cervical region: Secondary | ICD-10-CM | POA: Diagnosis not present

## 2020-02-11 DIAGNOSIS — M542 Cervicalgia: Secondary | ICD-10-CM | POA: Diagnosis not present

## 2020-03-13 DIAGNOSIS — M5412 Radiculopathy, cervical region: Secondary | ICD-10-CM | POA: Diagnosis not present

## 2020-03-13 DIAGNOSIS — M542 Cervicalgia: Secondary | ICD-10-CM | POA: Diagnosis not present

## 2020-04-12 DIAGNOSIS — M5412 Radiculopathy, cervical region: Secondary | ICD-10-CM | POA: Diagnosis not present

## 2020-04-12 DIAGNOSIS — M542 Cervicalgia: Secondary | ICD-10-CM | POA: Diagnosis not present

## 2020-04-17 DIAGNOSIS — R69 Illness, unspecified: Secondary | ICD-10-CM | POA: Diagnosis not present

## 2020-10-07 DIAGNOSIS — I10 Essential (primary) hypertension: Secondary | ICD-10-CM | POA: Diagnosis not present

## 2020-10-07 DIAGNOSIS — D171 Benign lipomatous neoplasm of skin and subcutaneous tissue of trunk: Secondary | ICD-10-CM | POA: Diagnosis not present

## 2020-10-07 DIAGNOSIS — Z1211 Encounter for screening for malignant neoplasm of colon: Secondary | ICD-10-CM | POA: Diagnosis not present

## 2020-10-07 DIAGNOSIS — R202 Paresthesia of skin: Secondary | ICD-10-CM | POA: Diagnosis not present

## 2020-10-07 DIAGNOSIS — E782 Mixed hyperlipidemia: Secondary | ICD-10-CM | POA: Diagnosis not present

## 2020-10-07 DIAGNOSIS — Z125 Encounter for screening for malignant neoplasm of prostate: Secondary | ICD-10-CM | POA: Diagnosis not present

## 2020-10-07 DIAGNOSIS — Z8719 Personal history of other diseases of the digestive system: Secondary | ICD-10-CM | POA: Diagnosis not present

## 2020-10-07 DIAGNOSIS — Z0000 Encounter for general adult medical examination without abnormal findings: Secondary | ICD-10-CM | POA: Diagnosis not present

## 2020-10-07 DIAGNOSIS — Z1389 Encounter for screening for other disorder: Secondary | ICD-10-CM | POA: Diagnosis not present

## 2020-10-07 DIAGNOSIS — R7309 Other abnormal glucose: Secondary | ICD-10-CM | POA: Diagnosis not present

## 2020-10-07 DIAGNOSIS — I493 Ventricular premature depolarization: Secondary | ICD-10-CM | POA: Diagnosis not present

## 2020-10-07 DIAGNOSIS — R7303 Prediabetes: Secondary | ICD-10-CM | POA: Diagnosis not present

## 2020-10-29 DIAGNOSIS — H5201 Hypermetropia, right eye: Secondary | ICD-10-CM | POA: Diagnosis not present

## 2020-11-10 DIAGNOSIS — D171 Benign lipomatous neoplasm of skin and subcutaneous tissue of trunk: Secondary | ICD-10-CM | POA: Diagnosis not present

## 2021-02-05 DIAGNOSIS — I493 Ventricular premature depolarization: Secondary | ICD-10-CM | POA: Diagnosis not present

## 2021-02-05 DIAGNOSIS — I10 Essential (primary) hypertension: Secondary | ICD-10-CM | POA: Diagnosis not present

## 2021-02-05 DIAGNOSIS — E782 Mixed hyperlipidemia: Secondary | ICD-10-CM | POA: Diagnosis not present

## 2021-02-05 DIAGNOSIS — E1169 Type 2 diabetes mellitus with other specified complication: Secondary | ICD-10-CM | POA: Diagnosis not present

## 2021-02-05 DIAGNOSIS — R202 Paresthesia of skin: Secondary | ICD-10-CM | POA: Diagnosis not present

## 2021-09-15 DIAGNOSIS — M25512 Pain in left shoulder: Secondary | ICD-10-CM | POA: Diagnosis not present

## 2021-09-15 DIAGNOSIS — M67912 Unspecified disorder of synovium and tendon, left shoulder: Secondary | ICD-10-CM | POA: Diagnosis not present

## 2021-10-06 DIAGNOSIS — M67911 Unspecified disorder of synovium and tendon, right shoulder: Secondary | ICD-10-CM | POA: Diagnosis not present

## 2021-10-06 DIAGNOSIS — M67912 Unspecified disorder of synovium and tendon, left shoulder: Secondary | ICD-10-CM | POA: Diagnosis not present

## 2021-10-06 DIAGNOSIS — M25511 Pain in right shoulder: Secondary | ICD-10-CM | POA: Diagnosis not present

## 2021-10-06 DIAGNOSIS — M25512 Pain in left shoulder: Secondary | ICD-10-CM | POA: Diagnosis not present

## 2021-11-02 DIAGNOSIS — Z8719 Personal history of other diseases of the digestive system: Secondary | ICD-10-CM | POA: Diagnosis not present

## 2021-11-02 DIAGNOSIS — Z1211 Encounter for screening for malignant neoplasm of colon: Secondary | ICD-10-CM | POA: Diagnosis not present

## 2021-11-02 DIAGNOSIS — I10 Essential (primary) hypertension: Secondary | ICD-10-CM | POA: Diagnosis not present

## 2021-11-02 DIAGNOSIS — E782 Mixed hyperlipidemia: Secondary | ICD-10-CM | POA: Diagnosis not present

## 2021-11-02 DIAGNOSIS — Z0000 Encounter for general adult medical examination without abnormal findings: Secondary | ICD-10-CM | POA: Diagnosis not present

## 2021-11-02 DIAGNOSIS — F439 Reaction to severe stress, unspecified: Secondary | ICD-10-CM | POA: Diagnosis not present

## 2021-11-02 DIAGNOSIS — Z125 Encounter for screening for malignant neoplasm of prostate: Secondary | ICD-10-CM | POA: Diagnosis not present

## 2021-11-02 DIAGNOSIS — R202 Paresthesia of skin: Secondary | ICD-10-CM | POA: Diagnosis not present

## 2021-11-02 DIAGNOSIS — R69 Illness, unspecified: Secondary | ICD-10-CM | POA: Diagnosis not present

## 2021-11-02 DIAGNOSIS — I493 Ventricular premature depolarization: Secondary | ICD-10-CM | POA: Diagnosis not present

## 2021-11-02 DIAGNOSIS — Z1331 Encounter for screening for depression: Secondary | ICD-10-CM | POA: Diagnosis not present

## 2021-11-02 DIAGNOSIS — E1169 Type 2 diabetes mellitus with other specified complication: Secondary | ICD-10-CM | POA: Diagnosis not present

## 2021-11-02 DIAGNOSIS — Z1389 Encounter for screening for other disorder: Secondary | ICD-10-CM | POA: Diagnosis not present

## 2021-11-02 DIAGNOSIS — D171 Benign lipomatous neoplasm of skin and subcutaneous tissue of trunk: Secondary | ICD-10-CM | POA: Diagnosis not present

## 2021-12-30 DIAGNOSIS — E119 Type 2 diabetes mellitus without complications: Secondary | ICD-10-CM | POA: Diagnosis not present

## 2022-01-01 DIAGNOSIS — D171 Benign lipomatous neoplasm of skin and subcutaneous tissue of trunk: Secondary | ICD-10-CM | POA: Diagnosis not present

## 2022-01-05 ENCOUNTER — Other Ambulatory Visit: Payer: Self-pay | Admitting: General Surgery

## 2022-01-06 ENCOUNTER — Other Ambulatory Visit: Payer: Self-pay | Admitting: *Deleted

## 2022-01-06 ENCOUNTER — Other Ambulatory Visit: Payer: Self-pay | Admitting: General Surgery

## 2022-01-06 DIAGNOSIS — D171 Benign lipomatous neoplasm of skin and subcutaneous tissue of trunk: Secondary | ICD-10-CM

## 2022-01-08 ENCOUNTER — Ambulatory Visit
Admission: RE | Admit: 2022-01-08 | Discharge: 2022-01-08 | Disposition: A | Discharge status: Home / Self Care | Payer: Medicare HMO | Source: Ambulatory Visit | Attending: General Surgery | Admitting: General Surgery

## 2022-01-08 DIAGNOSIS — D1779 Benign lipomatous neoplasm of other sites: Secondary | ICD-10-CM | POA: Diagnosis not present

## 2022-01-08 DIAGNOSIS — D171 Benign lipomatous neoplasm of skin and subcutaneous tissue of trunk: Secondary | ICD-10-CM

## 2022-02-20 DIAGNOSIS — E1142 Type 2 diabetes mellitus with diabetic polyneuropathy: Secondary | ICD-10-CM | POA: Diagnosis not present

## 2022-02-20 DIAGNOSIS — Z6832 Body mass index (BMI) 32.0-32.9, adult: Secondary | ICD-10-CM | POA: Diagnosis not present

## 2022-02-20 DIAGNOSIS — E669 Obesity, unspecified: Secondary | ICD-10-CM | POA: Diagnosis not present

## 2022-02-20 DIAGNOSIS — I10 Essential (primary) hypertension: Secondary | ICD-10-CM | POA: Diagnosis not present

## 2022-02-20 DIAGNOSIS — Z008 Encounter for other general examination: Secondary | ICD-10-CM | POA: Diagnosis not present

## 2022-02-20 DIAGNOSIS — E785 Hyperlipidemia, unspecified: Secondary | ICD-10-CM | POA: Diagnosis not present

## 2022-02-20 DIAGNOSIS — Z8249 Family history of ischemic heart disease and other diseases of the circulatory system: Secondary | ICD-10-CM | POA: Diagnosis not present

## 2022-02-20 DIAGNOSIS — Z833 Family history of diabetes mellitus: Secondary | ICD-10-CM | POA: Diagnosis not present

## 2022-04-05 ENCOUNTER — Other Ambulatory Visit: Payer: Self-pay | Admitting: General Surgery

## 2022-04-05 DIAGNOSIS — D171 Benign lipomatous neoplasm of skin and subcutaneous tissue of trunk: Secondary | ICD-10-CM | POA: Diagnosis not present

## 2022-04-19 DIAGNOSIS — R202 Paresthesia of skin: Secondary | ICD-10-CM | POA: Diagnosis not present

## 2022-04-19 DIAGNOSIS — I10 Essential (primary) hypertension: Secondary | ICD-10-CM | POA: Diagnosis not present

## 2022-04-19 DIAGNOSIS — I493 Ventricular premature depolarization: Secondary | ICD-10-CM | POA: Diagnosis not present

## 2022-04-19 DIAGNOSIS — E1169 Type 2 diabetes mellitus with other specified complication: Secondary | ICD-10-CM | POA: Diagnosis not present

## 2022-04-19 DIAGNOSIS — E782 Mixed hyperlipidemia: Secondary | ICD-10-CM | POA: Diagnosis not present

## 2022-04-26 DIAGNOSIS — J398 Other specified diseases of upper respiratory tract: Secondary | ICD-10-CM | POA: Diagnosis not present

## 2022-11-15 DIAGNOSIS — J069 Acute upper respiratory infection, unspecified: Secondary | ICD-10-CM | POA: Diagnosis not present

## 2022-12-16 DIAGNOSIS — Z0000 Encounter for general adult medical examination without abnormal findings: Secondary | ICD-10-CM | POA: Diagnosis not present

## 2022-12-16 DIAGNOSIS — I10 Essential (primary) hypertension: Secondary | ICD-10-CM | POA: Diagnosis not present

## 2022-12-16 DIAGNOSIS — E782 Mixed hyperlipidemia: Secondary | ICD-10-CM | POA: Diagnosis not present

## 2022-12-16 DIAGNOSIS — Z1211 Encounter for screening for malignant neoplasm of colon: Secondary | ICD-10-CM | POA: Diagnosis not present

## 2022-12-16 DIAGNOSIS — Z1331 Encounter for screening for depression: Secondary | ICD-10-CM | POA: Diagnosis not present

## 2022-12-16 DIAGNOSIS — Z8719 Personal history of other diseases of the digestive system: Secondary | ICD-10-CM | POA: Diagnosis not present

## 2022-12-16 DIAGNOSIS — R202 Paresthesia of skin: Secondary | ICD-10-CM | POA: Diagnosis not present

## 2022-12-16 DIAGNOSIS — E1169 Type 2 diabetes mellitus with other specified complication: Secondary | ICD-10-CM | POA: Diagnosis not present

## 2022-12-16 DIAGNOSIS — R053 Chronic cough: Secondary | ICD-10-CM | POA: Diagnosis not present

## 2022-12-16 DIAGNOSIS — Z125 Encounter for screening for malignant neoplasm of prostate: Secondary | ICD-10-CM | POA: Diagnosis not present

## 2022-12-16 DIAGNOSIS — I493 Ventricular premature depolarization: Secondary | ICD-10-CM | POA: Diagnosis not present

## 2022-12-16 DIAGNOSIS — E119 Type 2 diabetes mellitus without complications: Secondary | ICD-10-CM | POA: Diagnosis not present

## 2023-01-06 DIAGNOSIS — M1990 Unspecified osteoarthritis, unspecified site: Secondary | ICD-10-CM | POA: Diagnosis not present

## 2023-01-06 DIAGNOSIS — E785 Hyperlipidemia, unspecified: Secondary | ICD-10-CM | POA: Diagnosis not present

## 2023-01-06 DIAGNOSIS — Z8249 Family history of ischemic heart disease and other diseases of the circulatory system: Secondary | ICD-10-CM | POA: Diagnosis not present

## 2023-01-06 DIAGNOSIS — G629 Polyneuropathy, unspecified: Secondary | ICD-10-CM | POA: Diagnosis not present

## 2023-01-06 DIAGNOSIS — I10 Essential (primary) hypertension: Secondary | ICD-10-CM | POA: Diagnosis not present

## 2023-01-06 DIAGNOSIS — Z809 Family history of malignant neoplasm, unspecified: Secondary | ICD-10-CM | POA: Diagnosis not present

## 2023-01-06 DIAGNOSIS — Z833 Family history of diabetes mellitus: Secondary | ICD-10-CM | POA: Diagnosis not present

## 2023-01-06 DIAGNOSIS — Z008 Encounter for other general examination: Secondary | ICD-10-CM | POA: Diagnosis not present

## 2023-05-03 DIAGNOSIS — E119 Type 2 diabetes mellitus without complications: Secondary | ICD-10-CM | POA: Diagnosis not present

## 2023-06-24 DIAGNOSIS — I10 Essential (primary) hypertension: Secondary | ICD-10-CM | POA: Diagnosis not present

## 2023-06-24 DIAGNOSIS — I493 Ventricular premature depolarization: Secondary | ICD-10-CM | POA: Diagnosis not present

## 2023-06-24 DIAGNOSIS — R202 Paresthesia of skin: Secondary | ICD-10-CM | POA: Diagnosis not present

## 2023-06-24 DIAGNOSIS — E782 Mixed hyperlipidemia: Secondary | ICD-10-CM | POA: Diagnosis not present

## 2023-06-24 DIAGNOSIS — E119 Type 2 diabetes mellitus without complications: Secondary | ICD-10-CM | POA: Diagnosis not present

## 2023-09-26 DIAGNOSIS — M1611 Unilateral primary osteoarthritis, right hip: Secondary | ICD-10-CM | POA: Diagnosis not present

## 2023-09-26 DIAGNOSIS — M25561 Pain in right knee: Secondary | ICD-10-CM | POA: Diagnosis not present

## 2023-09-26 DIAGNOSIS — M25551 Pain in right hip: Secondary | ICD-10-CM | POA: Diagnosis not present

## 2023-10-05 DIAGNOSIS — M25551 Pain in right hip: Secondary | ICD-10-CM | POA: Diagnosis not present

## 2023-10-05 DIAGNOSIS — M25561 Pain in right knee: Secondary | ICD-10-CM | POA: Diagnosis not present

## 2023-10-07 DIAGNOSIS — M25551 Pain in right hip: Secondary | ICD-10-CM | POA: Diagnosis not present

## 2023-10-07 DIAGNOSIS — M25561 Pain in right knee: Secondary | ICD-10-CM | POA: Diagnosis not present

## 2023-10-10 DIAGNOSIS — M25561 Pain in right knee: Secondary | ICD-10-CM | POA: Diagnosis not present

## 2023-10-10 DIAGNOSIS — M25551 Pain in right hip: Secondary | ICD-10-CM | POA: Diagnosis not present

## 2023-10-12 DIAGNOSIS — M25561 Pain in right knee: Secondary | ICD-10-CM | POA: Diagnosis not present

## 2023-10-12 DIAGNOSIS — M25551 Pain in right hip: Secondary | ICD-10-CM | POA: Diagnosis not present

## 2023-10-18 DIAGNOSIS — M25551 Pain in right hip: Secondary | ICD-10-CM | POA: Diagnosis not present

## 2023-10-18 DIAGNOSIS — M25561 Pain in right knee: Secondary | ICD-10-CM | POA: Diagnosis not present

## 2023-10-20 DIAGNOSIS — M25551 Pain in right hip: Secondary | ICD-10-CM | POA: Diagnosis not present

## 2023-10-20 DIAGNOSIS — M25561 Pain in right knee: Secondary | ICD-10-CM | POA: Diagnosis not present

## 2023-10-25 DIAGNOSIS — M25561 Pain in right knee: Secondary | ICD-10-CM | POA: Diagnosis not present

## 2023-10-25 DIAGNOSIS — M25551 Pain in right hip: Secondary | ICD-10-CM | POA: Diagnosis not present

## 2023-10-27 DIAGNOSIS — M25561 Pain in right knee: Secondary | ICD-10-CM | POA: Diagnosis not present

## 2023-10-27 DIAGNOSIS — M25551 Pain in right hip: Secondary | ICD-10-CM | POA: Diagnosis not present

## 2023-10-31 DIAGNOSIS — M1611 Unilateral primary osteoarthritis, right hip: Secondary | ICD-10-CM | POA: Diagnosis not present

## 2023-11-11 ENCOUNTER — Other Ambulatory Visit: Payer: Self-pay | Admitting: Medical Genetics

## 2023-11-24 ENCOUNTER — Other Ambulatory Visit: Payer: Self-pay

## 2023-11-24 DIAGNOSIS — Z006 Encounter for examination for normal comparison and control in clinical research program: Secondary | ICD-10-CM

## 2023-11-30 DIAGNOSIS — M7061 Trochanteric bursitis, right hip: Secondary | ICD-10-CM | POA: Diagnosis not present

## 2023-11-30 DIAGNOSIS — M1611 Unilateral primary osteoarthritis, right hip: Secondary | ICD-10-CM | POA: Diagnosis not present

## 2023-12-02 LAB — GENECONNECT MOLECULAR SCREEN: Genetic Analysis Overall Interpretation: NEGATIVE

## 2024-01-06 DIAGNOSIS — R202 Paresthesia of skin: Secondary | ICD-10-CM | POA: Diagnosis not present

## 2024-01-06 DIAGNOSIS — Z125 Encounter for screening for malignant neoplasm of prostate: Secondary | ICD-10-CM | POA: Diagnosis not present

## 2024-01-06 DIAGNOSIS — Z8719 Personal history of other diseases of the digestive system: Secondary | ICD-10-CM | POA: Diagnosis not present

## 2024-01-06 DIAGNOSIS — I10 Essential (primary) hypertension: Secondary | ICD-10-CM | POA: Diagnosis not present

## 2024-01-06 DIAGNOSIS — E782 Mixed hyperlipidemia: Secondary | ICD-10-CM | POA: Diagnosis not present

## 2024-01-06 DIAGNOSIS — I493 Ventricular premature depolarization: Secondary | ICD-10-CM | POA: Diagnosis not present

## 2024-01-06 DIAGNOSIS — Z1331 Encounter for screening for depression: Secondary | ICD-10-CM | POA: Diagnosis not present

## 2024-01-06 DIAGNOSIS — Z0000 Encounter for general adult medical examination without abnormal findings: Secondary | ICD-10-CM | POA: Diagnosis not present

## 2024-01-06 DIAGNOSIS — M7061 Trochanteric bursitis, right hip: Secondary | ICD-10-CM | POA: Diagnosis not present

## 2024-01-06 DIAGNOSIS — Z1211 Encounter for screening for malignant neoplasm of colon: Secondary | ICD-10-CM | POA: Diagnosis not present

## 2024-01-06 DIAGNOSIS — E1169 Type 2 diabetes mellitus with other specified complication: Secondary | ICD-10-CM | POA: Diagnosis not present

## 2024-01-16 DIAGNOSIS — E782 Mixed hyperlipidemia: Secondary | ICD-10-CM | POA: Diagnosis not present

## 2024-01-16 DIAGNOSIS — E1169 Type 2 diabetes mellitus with other specified complication: Secondary | ICD-10-CM | POA: Diagnosis not present

## 2024-02-15 DIAGNOSIS — L84 Corns and callosities: Secondary | ICD-10-CM | POA: Diagnosis not present

## 2024-02-28 DIAGNOSIS — E782 Mixed hyperlipidemia: Secondary | ICD-10-CM | POA: Diagnosis not present

## 2024-02-28 DIAGNOSIS — E1169 Type 2 diabetes mellitus with other specified complication: Secondary | ICD-10-CM | POA: Diagnosis not present

## 2024-02-28 DIAGNOSIS — I10 Essential (primary) hypertension: Secondary | ICD-10-CM | POA: Diagnosis not present

## 2024-03-19 DIAGNOSIS — E1169 Type 2 diabetes mellitus with other specified complication: Secondary | ICD-10-CM | POA: Diagnosis not present

## 2024-03-19 DIAGNOSIS — K4090 Unilateral inguinal hernia, without obstruction or gangrene, not specified as recurrent: Secondary | ICD-10-CM | POA: Diagnosis not present

## 2024-04-06 DIAGNOSIS — K4020 Bilateral inguinal hernia, without obstruction or gangrene, not specified as recurrent: Secondary | ICD-10-CM | POA: Diagnosis not present

## 2024-04-23 DIAGNOSIS — D176 Benign lipomatous neoplasm of spermatic cord: Secondary | ICD-10-CM | POA: Diagnosis not present

## 2024-04-23 DIAGNOSIS — K4020 Bilateral inguinal hernia, without obstruction or gangrene, not specified as recurrent: Secondary | ICD-10-CM | POA: Diagnosis not present

## 2024-05-14 DIAGNOSIS — E119 Type 2 diabetes mellitus without complications: Secondary | ICD-10-CM | POA: Diagnosis not present

## 2024-08-14 ENCOUNTER — Encounter: Payer: Self-pay | Admitting: *Deleted
